# Patient Record
Sex: Male | Born: 1955 | Race: White | Hispanic: No | Marital: Married | Smoking: Former smoker
Health system: Southern US, Community
[De-identification: ages and names within clinical notes are randomized; demographics above are authoritative.]

## PROBLEM LIST (undated history)

## (undated) HISTORY — PX: JOINT REPLACEMENT: SHX530

---

## 2021-02-04 ENCOUNTER — Emergency Department (HOSPITAL_COMMUNITY): Payer: Medicare Other

## 2021-02-04 ENCOUNTER — Other Ambulatory Visit: Payer: Self-pay

## 2021-02-04 ENCOUNTER — Encounter (HOSPITAL_COMMUNITY): Payer: Self-pay

## 2021-02-04 ENCOUNTER — Emergency Department (HOSPITAL_COMMUNITY)
Admission: EM | Admit: 2021-02-04 | Discharge: 2021-02-04 | Disposition: A | Discharge status: Home / Self Care | Payer: Medicare Other | Attending: Emergency Medicine | Admitting: Emergency Medicine

## 2021-02-04 DIAGNOSIS — Z5321 Procedure and treatment not carried out due to patient leaving prior to being seen by health care provider: Secondary | ICD-10-CM | POA: Insufficient documentation

## 2021-02-04 DIAGNOSIS — R0789 Other chest pain: Secondary | ICD-10-CM | POA: Diagnosis present

## 2021-02-04 DIAGNOSIS — R059 Cough, unspecified: Secondary | ICD-10-CM | POA: Diagnosis not present

## 2021-02-04 LAB — BASIC METABOLIC PANEL
Anion gap: 5 (ref 5–15)
BUN: 13 mg/dL (ref 8–23)
CO2: 29 mmol/L (ref 22–32)
Calcium: 8.8 mg/dL — ABNORMAL LOW (ref 8.9–10.3)
Chloride: 104 mmol/L (ref 98–111)
Creatinine, Ser: 0.82 mg/dL (ref 0.61–1.24)
GFR, Estimated: >60 mL/min (ref >60)
Glucose, Bld: 93 mg/dL (ref 70–99)
Potassium: 4.1 mmol/L (ref 3.5–5.1)
Sodium: 138 mmol/L (ref 135–145)

## 2021-02-04 LAB — CBC
HCT: 43 % (ref 39.0–52.0)
Hemoglobin: 14.3 g/dL (ref 13.0–17.0)
MCH: 30.6 pg (ref 26.0–34.0)
MCHC: 33.3 g/dL (ref 30.0–36.0)
MCV: 91.9 fL (ref 80.0–100.0)
Platelets: 182 10*3/uL (ref 150–400)
RBC: 4.68 MIL/uL (ref 4.22–5.81)
RDW: 12.9 % (ref 11.5–15.5)
WBC: 4.8 10*3/uL (ref 4.0–10.5)
nRBC: 0 % (ref 0.0–0.2)

## 2021-02-04 LAB — TROPONIN I (HIGH SENSITIVITY): Troponin I (High Sensitivity): 3 ng/L (ref <18)

## 2021-02-04 NOTE — ED Notes (Signed)
Pt states he wants to leave d/t wait time. Triage RN Melissa notified d/t pt acuity.

## 2021-02-04 NOTE — ED Triage Notes (Signed)
Patient reports chest cough that started this morning that has now turned to chest pain and pressure, denies any radiating pain

## 2022-05-15 IMAGING — CR DG CHEST 2V
2 series · 2 of 2 positions shown · non-contrast
Comparison: None.

CLINICAL DATA: Chest pain and cough

EXAM:
CHEST - 2 VIEW

[chest pa]
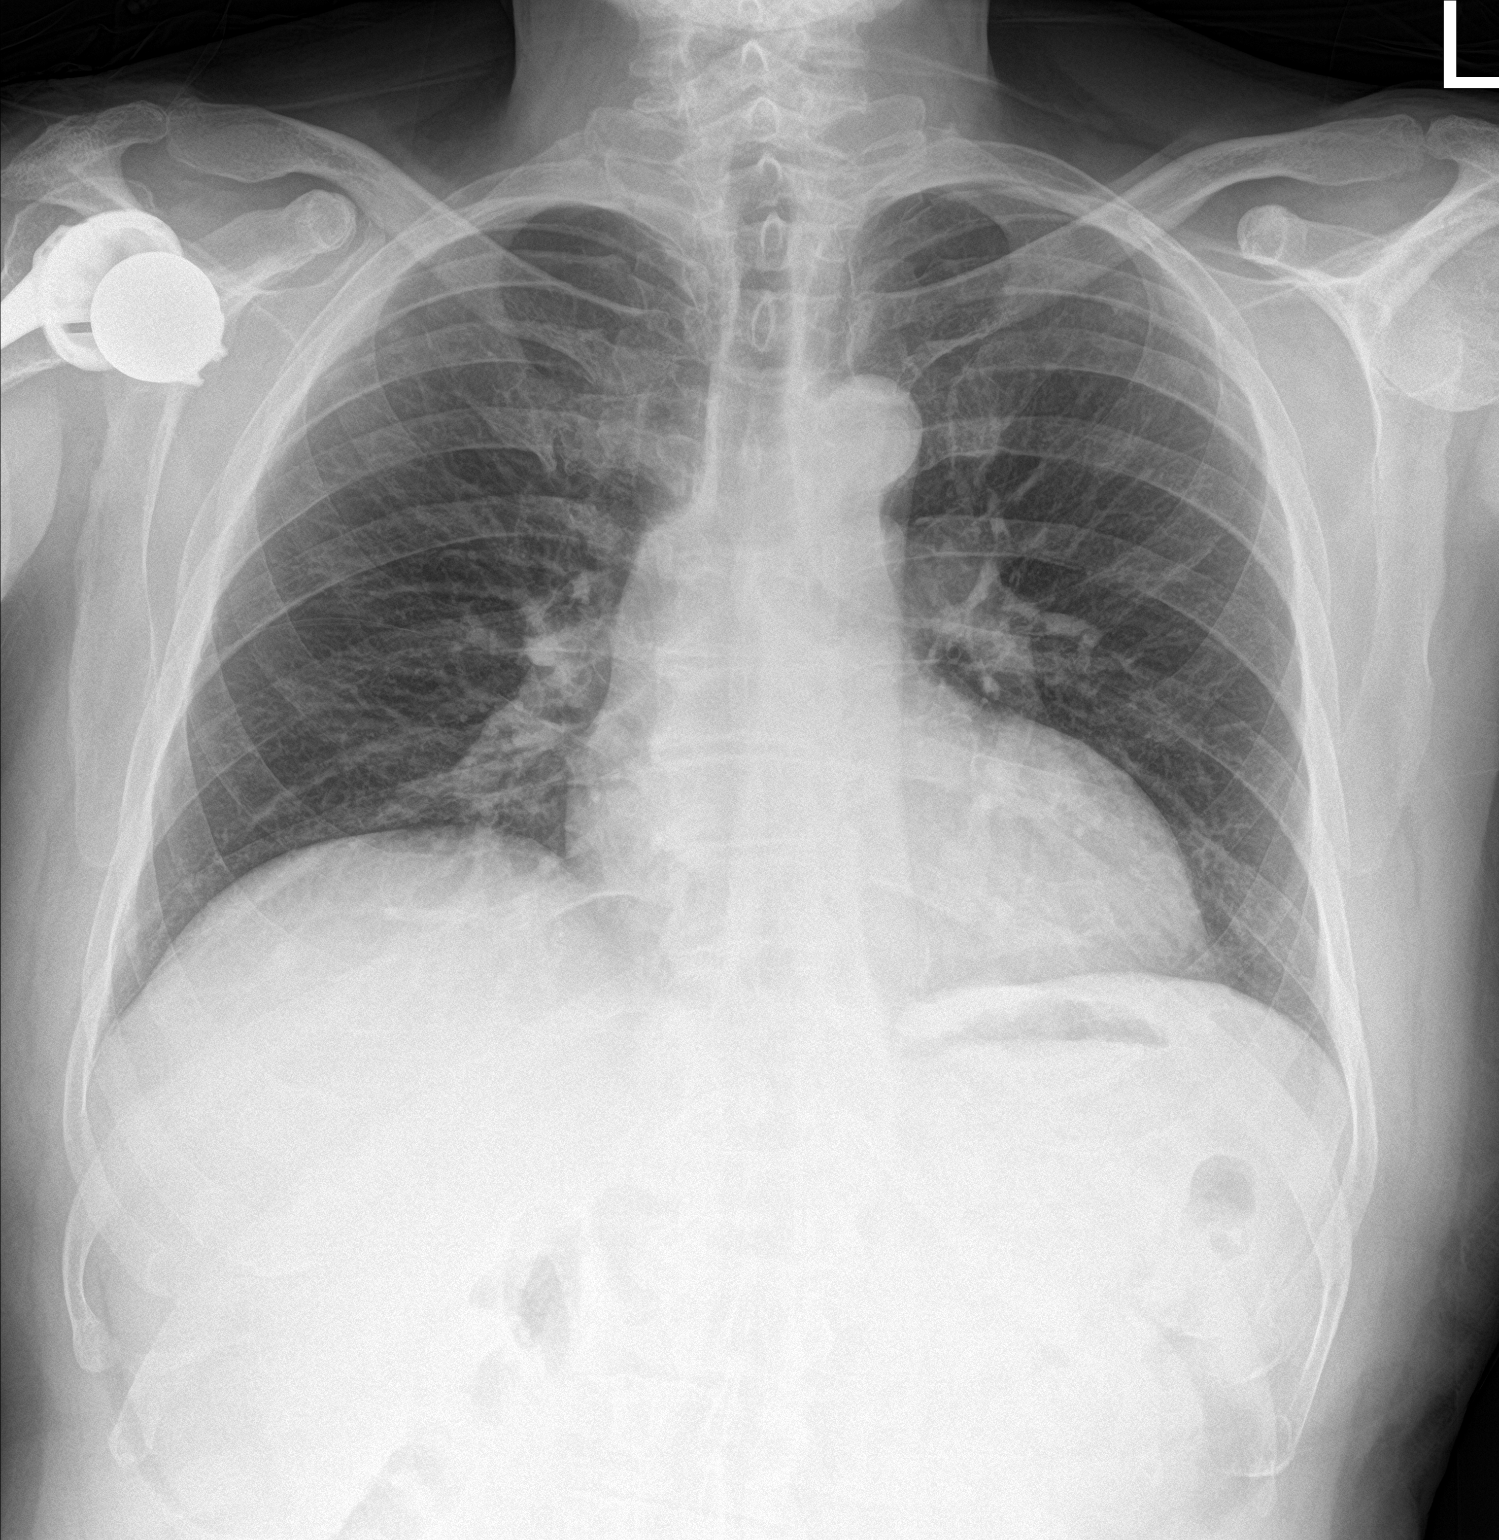

[chest lat]
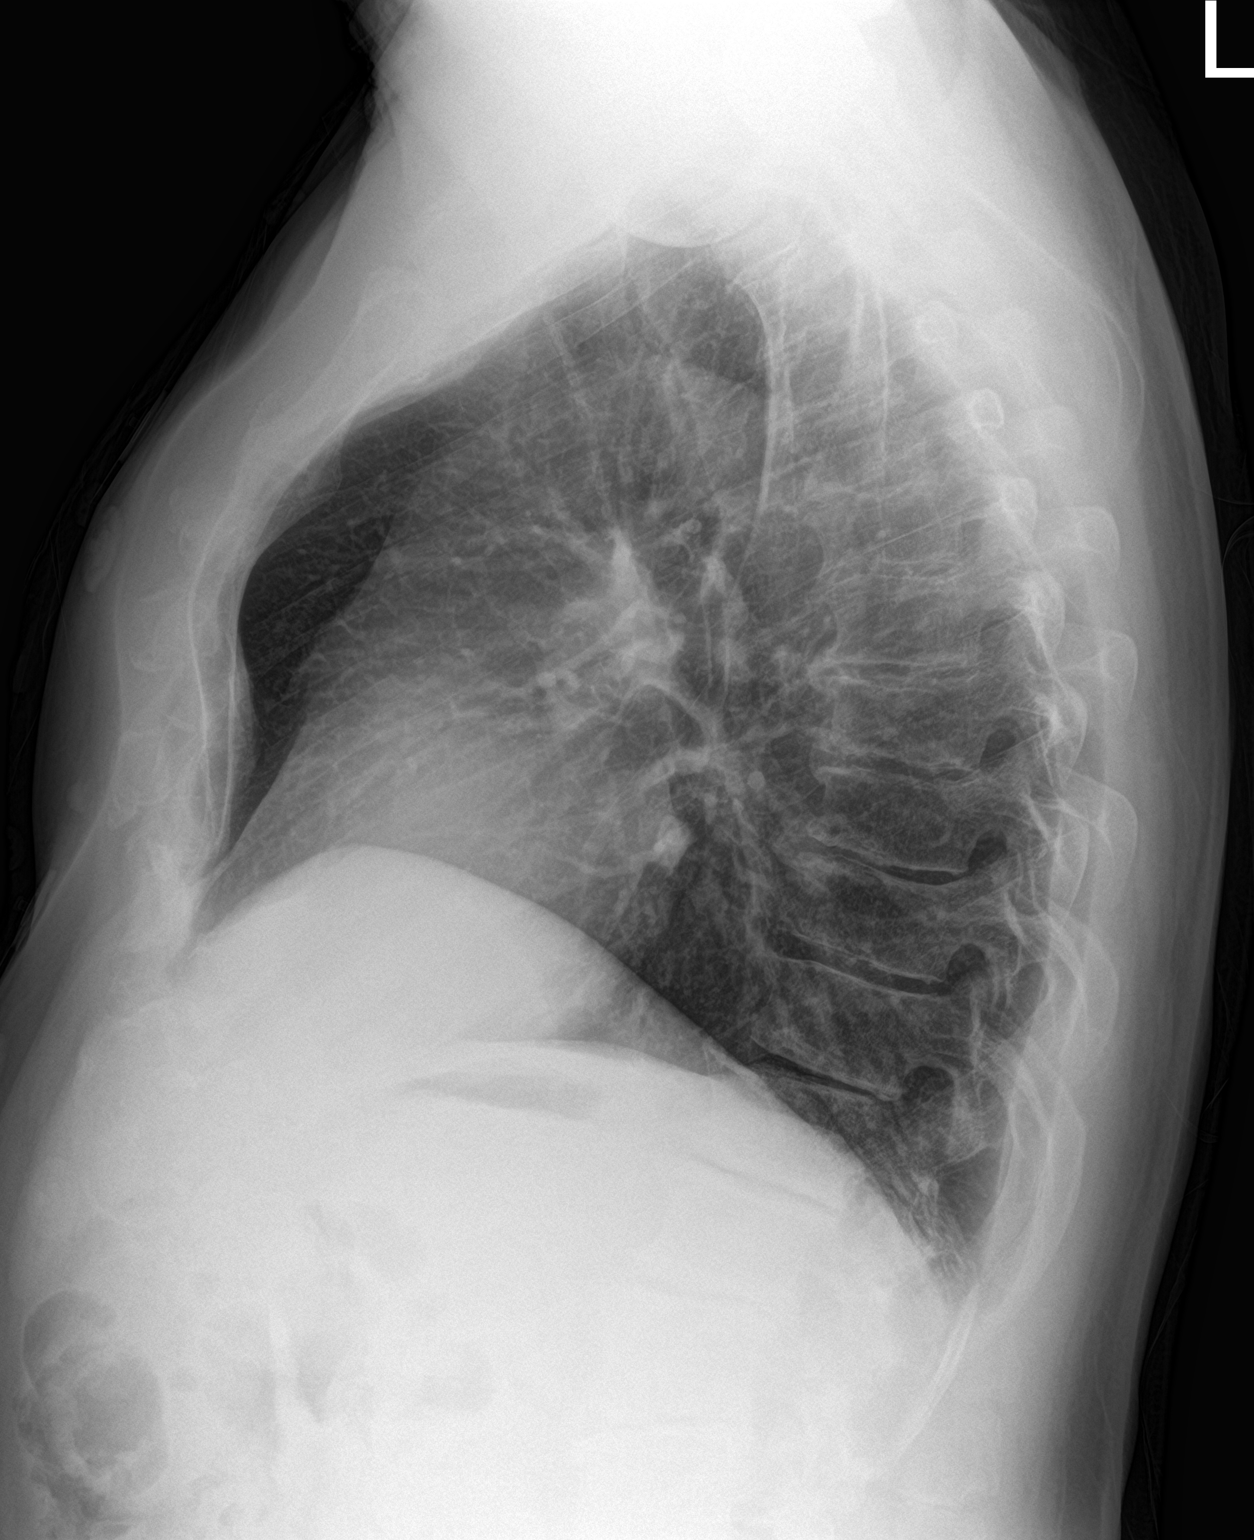

[2 of 2 positions shown; findings below may reference images not displayed]

FINDINGS: Borderline cardiac enlargement. Low lung volumes with bibasilar
atelectasis. No visible pleural effusion or pneumothorax. Reverse
right shoulder arthroplasty.
IMPRESSION: Borderline cardiac enlargement. Low lung volumes with bibasilar
atelectasis.

## 2022-06-03 NOTE — Therapy (Signed)
OUTPATIENT PHYSICAL THERAPY LOWER EXTREMITY EVALUATION   Patient Name: Levi Strickland MRN: [id number] DOB:February 29, 1956, 66 y.o., male Today's Date: 06/04/2022   PT End of Session - 06/04/22 1418     Visit Number 1    Authorization Type Medicare AB    Progress Note Due on Visit 10    PT Start Time 1420    PT Stop Time 1510    PT Time Calculation (min) 50 min    Equipment Utilized During Treatment Other (comment)   single point cane   Activity Tolerance Patient tolerated treatment well    Behavior During Therapy [facility] for tasks assessed/performed             History reviewed. No pertinent past medical history. Past Surgical History:  Procedure Laterality Date   JOINT REPLACEMENT     There are no problems to display for this patient.   PCP: none  REFERRING PROVIDER: Bethann Berkshire MD  REFERRING DIAG: aftercare following left knee joint replacement surgery  THERAPY DIAG:  Left knee pain, unspecified chronicity  Stiffness of left knee, not elsewhere classified  Other abnormalities of gait and mobility  Edema, unspecified type  Rationale for Evaluation and Treatment Rehabilitation  ONSET DATE: 05/22/22 L TKA  SUBJECTIVE:   SUBJECTIVE STATEMENT: Pt reports recent L TKA in [place] in setting of chronic knee pain, fluctuating. Had previous cortisone shots with some relief. Pt received HH PT after surgery in [place]. Follows up with surgeon virtually. Does note that since surgery he has begun to have more soreness in L hip while doing exercises  PERTINENT HISTORY: Previous MSK surgeries  PAIN:  Are you having pain: yes, 2/10 Location: medial L knee How would you describe your pain? Sharp, deep Best: 0/10 Worst: 9/10 Aggravating factors: walking >36min, transfers, lower body dressing, lifting/carrying Easing factors: ice, elevated  PRECAUTIONS: Knee (TKA)  WEIGHT BEARING RESTRICTIONS No  FALLS:  Has patient fallen in last 6 months? 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    PALPATION: Palpable tightness L quad/TFL and hip musculature  LOWER EXTREMITY ROM:  Active/Passive ROM Right eval Left eval  Knee flexion [facility] 78/85  Knee extension 0 -8/-5   (Blank rows = not tested)  Comments: Performed passive physiological movement flex/extension of L knee with gentle oscillations to reduce muscle guarding, pt reports good relief    LOWER EXTREMITY MMT:    MMT Right eval Left eval  Hip flexion 4 4  Hip abduction (modified sitting) 5 5  Knee flexion    Knee extension     (Blank rows = not tested)  Comments: strain with L hip flexion, no pain. Knee MMT deferred  given proximity to surgery   FUNCTIONAL TESTS:  5 times sit to stand: 14 sec STS   - standard chair, no UE support. Significant anterior positioning of LLE and weight shift to RLE noted  GAIT: Distance walked: within clinic Assistive device utilized: Single point cane Level of assistance: Modified independence Comments: partial step through pattern, SPC in RUE, fwd flexed posture, reduced stance time and step length on LLE    TODAY'S TREATMENT: [facility] Adult PT  Treatment:                                                DATE: 06/04/22 Therapeutic Exercise: Quad set x10, soft pillow below knee, cues for setup, tactile cues for quad  Seated heel slide knee flex x10, strap and towel to reduce friction, cues for setup and appropriate ROM, education for HEP LAQ x10, cues and demonstrations for appropriate setup, education as below for HEP   PATIENT EDUCATION:  Education details: Pt education on PT impairments, prognosis, and POC. Rationale for interventions, safe/appropriate HEP performance Person educated: Patient Education method: Explanation, Demonstration, Tactile cues, Verbal cues, and Handouts Education comprehension: verbalized understanding, returned demonstration, verbal cues required, tactile cues required, and needs further education    HOME EXERCISE PROGRAM: Access Code: [email or web address] URL: [email or web address] Date: 06/04/2022 Prepared by: Fransisco Hertz  Exercises - Supine Quad Set  - 1 x daily - 7 x weekly - 3 sets - 10 reps - Seated Long Arc Quad  - 1 x daily - 7 x weekly - 3 sets - 10 reps - Seated Knee Flexion Extension AROM (seated heel slide with strap)  - 1 x daily - 7 x weekly - 3 sets - 10 reps  ASSESSMENT:  CLINICAL IMPRESSION: Patient is a 66 y.o. gentleman who was seen today for physical therapy evaluation and treatment for L knee s/p TKA on 05/22/22. 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 Pt demonstrates gait impairments as noted above, as well as reduced L knee mobility and muscle weakness consistent with post op status. Localized swelling noted in L knee joint as expected, no clinical signs of DVT noted. Pt performs 5xSTS in 14sec which is at/close to fall risk category (12-14sec, varying by population), significantly altered mechanics. Reports good relief with passive physiological movement of knee  joint, gentle joint oscillations to reduce muscle guarding. Pt departs with no notable increase in pain, tolerates session well. Recommend skilled PT to address aforementioned deficits and maximize functional independence.   OBJECTIVE IMPAIRMENTS Abnormal gait, decreased activity tolerance, decreased endurance, decreased mobility, difficulty walking, decreased ROM, decreased strength, hypomobility, increased edema, and pain.   ACTIVITY LIMITATIONS carrying, lifting, bending, standing, squatting, transfers, and dressing  PARTICIPATION LIMITATIONS: driving, community activity, and occupation  PERSONAL FACTORS  None  are also affecting patient's functional outcome.   REHAB POTENTIAL: Good  CLINICAL DECISION MAKING: Stable/uncomplicated  EVALUATION COMPLEXITY: Low   GOALS: Goals reviewed with patient? Yes  SHORT TERM GOALS: Target date: 07/02/2022  Pt will demonstrate appropriate understanding and performance of initially prescribed HEP in order to facilitate improved independence with management of symptoms.  Baseline: HEP provided on eval Goal status: INITIAL   2. Pt will score greater than or equal to 54 on FOTO in order to demonstrate improved perception of function  due to symptoms.  Baseline: 49  Goal status: INITIAL   LONG TERM GOALS: Target date: 07/30/2022   Pt will score 62 or greater on FOTO in order to demonstrate improved perception of function due to symptoms.  Baseline: 49 Goal status: INITIAL  2.  Pt will demonstrate at least 0-120 degrees of L knee AROM in order to facilitate improved tolerance to functional movements such as walking and lifting.  Baseline: -8 ext, 78 flexion Goal status: INITIAL  3.  Pt will be able to lift up to 10# in order to demonstrate improved capacity for daily activities such as lifting/carrying drapery for work activities.  Baseline: NT due to proximity to surgery Goal status: INITIAL  4.  Pt will be able to perform 5xSTS in less than  or equal to 11sec with appropriate mechanics in order to demonstrate reduced fall risk and improved functional independence (MCID 5xSTS = 2.3 sec). Baseline: 14 sec, with significant anterior placement of LLE Goal status: INITIAL    PLAN: PT FREQUENCY: 2x/week  PT DURATION: 8 weeks  PLANNED INTERVENTIONS: Therapeutic exercises, Therapeutic activity, Neuromuscular re-education, Balance training, Gait training, Patient/Family education, Self Care, Joint mobilization, Stair training, DME instructions, Dry Needling, Electrical stimulation, Cryotherapy, Moist heat, Vasopneumatic device, Manual therapy, and Re-evaluation  PLAN FOR NEXT SESSION: Progress ROM/strengthening exercises as able/appropriate, review HEP.    Ashley Murrain PT, DPT 06/04/2022 4:47 PM

## 2022-06-04 ENCOUNTER — Encounter: Payer: Self-pay | Admitting: Physical Therapy

## 2022-06-04 ENCOUNTER — Ambulatory Visit: Payer: Medicare Other | Attending: Family Medicine | Admitting: Physical Therapy

## 2022-06-04 ENCOUNTER — Ambulatory Visit: Payer: Medicare Other | Admitting: Physical Therapy

## 2022-06-04 ENCOUNTER — Other Ambulatory Visit: Payer: Self-pay

## 2022-06-04 DIAGNOSIS — M25662 Stiffness of left knee, not elsewhere classified: Secondary | ICD-10-CM | POA: Diagnosis present

## 2022-06-04 DIAGNOSIS — R609 Edema, unspecified: Secondary | ICD-10-CM | POA: Diagnosis present

## 2022-06-04 DIAGNOSIS — M25562 Pain in left knee: Secondary | ICD-10-CM | POA: Insufficient documentation

## 2022-06-04 DIAGNOSIS — R2689 Other abnormalities of gait and mobility: Secondary | ICD-10-CM | POA: Insufficient documentation

## 2022-06-13 ENCOUNTER — Ambulatory Visit: Payer: Medicare Other | Admitting: Physical Therapy

## 2022-06-13 NOTE — Therapy (Signed)
OUTPATIENT PHYSICAL THERAPY TREATMENT NOTE   Patient Name: Levi Strickland MRN: [id number] DOB:23-Oct-1955, 66 y.o., male Today's Date: 06/14/2022  PCP: none in chart   REFERRING PROVIDER: Bethann Berkshire MD  END OF SESSION:   PT End of Session - 06/14/22 1017     Visit Number 2    Number of Visits 17    Date for PT Re-Evaluation 08/02/22    Authorization Type Medicare AB    Progress Note Due on Visit 10    PT Start Time 1017    PT Stop Time 1100    PT Time Calculation (min) 43 min    Activity Tolerance Patient tolerated treatment well    Behavior During Therapy [facility] for tasks assessed/performed             History reviewed. No pertinent past medical history. Past Surgical History:  Procedure Laterality Date   JOINT REPLACEMENT     There are no problems to display for this patient.   REFERRING DIAG: aftercare following left knee joint replacement surgery  THERAPY DIAG:  Left knee pain, unspecified chronicity  Stiffness of left knee, not elsewhere classified  Other abnormalities of gait and mobility  Edema, unspecified type  Rationale for Evaluation and Treatment Rehabilitation  PERTINENT HISTORY: Previous MSK surgeries  PRECAUTIONS: s/p TKA 05/22/22  SUBJECTIVE: Pt arrives without cane, states he has been off it for a few days and feels pretty steady. Pt reports good compliance with HEP, no issues, reports minimal pain at present. No other new updates.   PAIN:  Are you having pain: yes, 1/10 Location: medial L knee How would you describe your pain? Sharp, deep Best in past week: 0/10 Worst in past week: 7/10 Aggravating factors: walking >53min, transfers, lower body dressing, lifting/carrying, prolonged sitting/driving Easing factors: ice, elevated   OBJECTIVE: (objective measures completed at initial evaluation unless otherwise dated)   DIAGNOSTIC FINDINGS:  None in chart     PATIENT SURVEYS:  FOTO 49   COGNITION:           Overall  cognitive status: Within functional limits for tasks assessed                          SENSATION: Does not endorse any sensory deficits   EDEMA:  Generalized swelling L knee joint - figure 8 deferred as pt arrives with dressing on incision - appears clean and intact, no concerns.       PALPATION: Palpable tightness L quad/TFL and hip musculature   LOWER EXTREMITY ROM:   Active/Passive ROM Right eval Left eval  Knee flexion [facility] 78/85  Knee extension 0 -8/-5   (Blank rows = not tested)   Comments: Performed passive physiological movement flex/extension of L knee with gentle oscillations to reduce muscle guarding, pt reports good relief       LOWER EXTREMITY MMT:     MMT Right eval Left eval  Hip flexion 4 4  Hip abduction (modified sitting) 5 5  Knee flexion      Knee extension       (Blank rows = not tested)   Comments: strain with L hip flexion, no pain. Knee MMT deferred given proximity to surgery     FUNCTIONAL TESTS:  5 times sit to stand: 14 sec STS   - standard chair, no UE support. Significant anterior positioning of LLE and weight shift to RLE noted   GAIT: Distance walked: within clinic Assistive device utilized:  Single point cane Level of assistance: Modified independence Comments: partial step through pattern, SPC in RUE, fwd flexed posture, reduced stance time and step length on LLE       TODAY'S TREATMENT: [facility] Adult PT Treatment:                                                DATE: 06/14/22 Therapeutic Exercise: Seated heel slide knee flex 2x10, strap + slider, cues for hold and breath control LAQ 2x10 Quad set 3x10, cues for reduced compensation at hip SLR 2x8 cues for pacing and full knee extension Bridge 2x10 cues for form STS 2x12 from raised mat, cues for symmetrical WB Lateral 4 inch step up 2x8 with UE support, cues for improved knee mechanics/velocity  Manual Therapy: Reclined + supine: soft tissue mobilization to quad, TFL,  hamstring passive physiological movement to knee joint flex/ext within tolerated ROM, oscillations for reduced muscle guarding   [facility] Adult PT Treatment:                                                DATE: 06/04/22 Therapeutic Exercise: Quad set x10, soft pillow below knee, cues for setup, tactile cues for quad  Seated heel slide knee flex x10, strap and towel to reduce friction, cues for setup and appropriate ROM, education for HEP LAQ x10, cues and demonstrations for appropriate setup, education as below for HEP     PATIENT EDUCATION:  Education details: HEP, rationale for interventions throughout Person educated: Patient Education method: Explanation, Demonstration, Tactile cues, Verbal cues Education comprehension: verbalized understanding, returned demonstration, verbal cues required, tactile cues required, and needs further education      HOME EXERCISE PROGRAM: Access Code: [email or web address] URL: [email or web address] Date: 06/04/2022 Prepared by: Fransisco Hertz   Exercises - Supine Quad Set  - 1 x daily - 7 x weekly - 3 sets - 10 reps - Seated Long Arc Quad  - 1 x daily - 7 x weekly - 3 sets - 10 reps - Seated Knee Flexion Extension AROM (seated heel slide with strap)  - 1 x daily - 7 x weekly - 3 sets - 10 reps   ASSESSMENT:   CLINICAL IMPRESSION: Patient is a 66 y.o. gentleman who was seen today for physical therapy evaluation and treatment for L knee s/p TKA on 05/22/22.  Today's session emphasizing quad activation, closed chain LE stability, and knee mobility. Able to progress for increased volume of activity and reduced rest breaks. Pt also able to demonstrate appropriate SLR on this date without lag, tolerates well. Pt continues to demonstrate limitations in knee mobility and strength, gait impairments, and closed chain stability of LE, all as expected post operatively. Denies any increase in pain with activity on this date, tolerates session well overall with no  adverse events.   Recommend skilled PT to address aforementioned deficits to maximize functional independence and tolerance. Pt departs today's session in no acute distress, all voiced questions/concerns addressed appropriately from PT perspective.         OBJECTIVE IMPAIRMENTS Abnormal gait, decreased activity tolerance, decreased endurance, decreased mobility, difficulty walking, decreased ROM, decreased strength, hypomobility, increased edema, and pain.    ACTIVITY LIMITATIONS carrying, lifting, bending, standing, squatting, transfers, and  dressing   PARTICIPATION LIMITATIONS: driving, community activity, and occupation   PERSONAL FACTORS  None  are also affecting patient's functional outcome.    REHAB POTENTIAL: Good   CLINICAL DECISION MAKING: Stable/uncomplicated   EVALUATION COMPLEXITY: Low     GOALS: Goals reviewed with patient? Yes   SHORT TERM GOALS: Target date: 07/02/2022  Pt will demonstrate appropriate understanding and performance of initially prescribed HEP in order to facilitate improved independence with management of symptoms.  Baseline: HEP provided on eval Goal status: INITIAL    2. Pt will score greater than or equal to 54 on FOTO in order to demonstrate improved perception of function due to symptoms.            Baseline: 49            Goal status: INITIAL    LONG TERM GOALS: Target date: 07/30/2022    Pt will score 62 or greater on FOTO in order to demonstrate improved perception of function due to symptoms.  Baseline: 49 Goal status: INITIAL   2.  Pt will demonstrate at least 0-120 degrees of L knee AROM in order to facilitate improved tolerance to functional movements such as walking and lifting.  Baseline: -8 ext, 78 flexion Goal status: INITIAL   3.  Pt will be able to lift up to 10# in order to demonstrate improved capacity for daily activities such as lifting/carrying drapery for work activities.  Baseline: NT due to proximity to surgery Goal  status: INITIAL   4.  Pt will be able to perform 5xSTS in less than or equal to 11sec with appropriate mechanics in order to demonstrate reduced fall risk and improved functional independence (MCID 5xSTS = 2.3 sec). Baseline: 14 sec, with significant anterior placement of LLE Goal status: INITIAL      PLAN: PT FREQUENCY: 2x/week   PT DURATION: 8 weeks   PLANNED INTERVENTIONS: Therapeutic exercises, Therapeutic activity, Neuromuscular re-education, Balance training, Gait training, Patient/Family education, Self Care, Joint mobilization, Stair training, DME instructions, Dry Needling, Electrical stimulation, Cryotherapy, Moist heat, Vasopneumatic device, Manual therapy, and Re-evaluation   PLAN FOR NEXT SESSION: Progress ROM/strengthening exercises as able/appropriate, review/update HEP.      Leeroy Cha PT, DPT 06/14/2022 11:15 AM

## 2022-06-14 ENCOUNTER — Encounter: Payer: Self-pay | Admitting: Physical Therapy

## 2022-06-14 ENCOUNTER — Ambulatory Visit: Payer: Medicare Other | Admitting: Physical Therapy

## 2022-06-14 DIAGNOSIS — M25562 Pain in left knee: Secondary | ICD-10-CM

## 2022-06-14 DIAGNOSIS — R609 Edema, unspecified: Secondary | ICD-10-CM

## 2022-06-14 DIAGNOSIS — M25662 Stiffness of left knee, not elsewhere classified: Secondary | ICD-10-CM

## 2022-06-14 DIAGNOSIS — R2689 Other abnormalities of gait and mobility: Secondary | ICD-10-CM

## 2022-06-17 ENCOUNTER — Ambulatory Visit: Payer: Medicare Other | Admitting: Physical Therapy

## 2022-06-18 NOTE — Therapy (Signed)
OUTPATIENT PHYSICAL THERAPY TREATMENT NOTE   Patient Name: Levi Strickland MRN: [id number] DOB:08/03/1956, 66 y.o., male Today's Date: 06/19/2022  PCP: none in chart   REFERRING PROVIDER: Bethann Berkshire MD  END OF SESSION:   PT End of Session - 06/19/22 0936     Visit Number 3    Number of Visits 17    Date for PT Re-Evaluation 08/02/22    Authorization Type Medicare AB    Progress Note Due on Visit 10    PT Start Time 0936    PT Stop Time 1009    PT Time Calculation (min) 33 min    Activity Tolerance Patient tolerated treatment well;No increased pain    Behavior During Therapy [facility] for tasks assessed/performed              History reviewed. No pertinent past medical history. Past Surgical History:  Procedure Laterality Date   JOINT REPLACEMENT     There are no problems to display for this patient.   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 Denies any difficulty with HEP, good compliance. States he continues to do well overall and has been trying to do stairs with reciprocal pattern. No other new updates  PAIN:  Are you having pain: yes, 1/10 Location: medial L knee How would you describe your pain? Sharp, deep Best in past week: 0/10 Worst in past week: 3-4/10 Aggravating factors: walking >48min, transfers, lower body dressing, lifting/carrying, prolonged sitting/driving Easing factors: ice, elevated   OBJECTIVE: (objective measures completed at initial evaluation unless otherwise dated)   DIAGNOSTIC FINDINGS:  None in  chart     PATIENT SURVEYS:  FOTO 49   COGNITION:           Overall cognitive status: Within functional limits for tasks assessed                          SENSATION: Does not endorse any sensory deficits   EDEMA:  Generalized swelling L knee joint - figure 8 deferred as pt arrives with dressing on incision - appears clean and intact, no concerns.       PALPATION: Palpable tightness L quad/TFL and hip musculature   LOWER EXTREMITY ROM:   Active/Passive ROM Right eval Left eval  Knee flexion [facility] 78/85  Knee extension 0 -8/-5   (Blank rows = not tested)   Comments: Performed passive physiological movement flex/extension of L knee with gentle oscillations to reduce muscle guarding, pt reports good relief       LOWER EXTREMITY MMT:     MMT Right eval Left eval  Hip flexion 4 4  Hip abduction (modified sitting) 5 5  Knee flexion      Knee extension       (Blank rows = not tested)   Comments: strain with L hip flexion, no pain. Knee MMT deferred given proximity to surgery     FUNCTIONAL TESTS:  5 times sit to stand: 14 sec STS   - standard chair, no UE support. Significant anterior positioning of LLE and weight shift to RLE noted   GAIT:  Distance walked: within clinic Assistive device utilized: Single point cane Level of assistance: Modified independence Comments: partial step through pattern, SPC in RUE, fwd flexed posture, reduced stance time and step length on LLE       TODAY'S TREATMENT: [facility] Adult PT Treatment:                                                DATE: 06/19/22 Therapeutic Exercise: Seated heel slide knee flex x20, strap + slider, cues for hold and breath control Quad set 3x10 propped on towel SLR 3x12 cues for pacing and full knee extension Bridge 2x10 cues for form STS 2x10 from raised mat, cues for symmetrical WB Fwd lunge onto airex 3x8, UE support as needed  Manual Therapy: Reclined + supine: soft tissue mobilization to quad, TFL,  hamstring passive physiological movement to knee joint flex/ext within tolerated ROM, oscillations for reduced muscle guarding Contract relax hamstrings 2x5 towards full knee ext with hip at 45 deg flex   [facility] Adult PT Treatment:                                                DATE: 06/14/22 Therapeutic Exercise: Seated heel slide knee flex 2x10, strap + slider, cues for hold and breath control LAQ 2x10 Quad set 3x10, cues for reduced compensation at hip SLR 2x8 cues for pacing and full knee extension Bridge 2x10 cues for form STS 2x12 from raised mat, cues for symmetrical WB Lateral 4 inch step up 2x8 with UE support, cues for improved knee mechanics/velocity  Manual Therapy: Reclined + supine: soft tissue mobilization to quad, TFL, hamstring passive physiological movement to knee joint flex/ext within tolerated ROM, oscillations for reduced muscle guarding      PATIENT EDUCATION:  Education details: HEP, rationale for interventions throughout Person educated: Patient Education method: Explanation, Demonstration, Tactile cues, Verbal cues Education comprehension: verbalized understanding, returned demonstration, verbal cues required, tactile cues required, and needs further education      HOME EXERCISE PROGRAM: Access Code: [email or web address] URL: [email or web address] Date: 06/04/2022 Prepared by: Fransisco Hertz   Exercises - Supine Quad Set  - 1 x daily - 7 x weekly - 3 sets - 10 reps - Seated Long Arc Quad  - 1 x daily - 7 x weekly - 3 sets - 10 reps - Seated Knee Flexion Extension AROM (seated heel slide with strap)  - 1 x daily - 7 x weekly - 3 sets - 10 reps   ASSESSMENT:   CLINICAL IMPRESSION: Patient is a 66 y.o. gentleman who was seen today for physical therapy treatment for L knee s/p TKA on 05/22/22. Today's session emphasizing quad activation/endurance in open and closed chain, knee mobility. Addition of hamstring contract/relax on LLE with good response,  improved mobility. Able to progress for increased volume with activity and introduction of unstable surfaces for increased lower limb stability. Initial instability with unstable surface training but improves with repetition. Pt continues to demonstrate post op impairments as expected, including reduced knee mobility/strength and impaired mechanics with functional movements. Departs with report of no increase in pain, tolerates session well overall.  Recommend skilled PT to address aforementioned deficits to maximize functional independence and tolerance. Pt departs today's session in  no acute distress, all voiced questions/concerns addressed appropriately from PT perspective.      OBJECTIVE IMPAIRMENTS Abnormal gait, decreased activity tolerance, decreased endurance, decreased mobility, difficulty walking, decreased ROM, decreased strength, hypomobility, increased edema, and pain.    ACTIVITY LIMITATIONS carrying, lifting, bending, standing, squatting, transfers, and dressing   PARTICIPATION LIMITATIONS: driving, community activity, and occupation   PERSONAL FACTORS  None  are also affecting patient's functional outcome.    REHAB POTENTIAL: Good   CLINICAL DECISION MAKING: Stable/uncomplicated   EVALUATION COMPLEXITY: Low     GOALS: Goals reviewed with patient? Yes   SHORT TERM GOALS: Target date: 07/02/2022  Pt will demonstrate appropriate understanding and performance of initially prescribed HEP in order to facilitate improved independence with management of symptoms.  Baseline: HEP provided on eval Goal status: INITIAL    2. Pt will score greater than or equal to 54 on FOTO in order to demonstrate improved perception of function due to symptoms.            Baseline: 49            Goal status: INITIAL    LONG TERM GOALS: Target date: 07/30/2022    Pt will score 62 or greater on FOTO in order to demonstrate improved perception of function due to symptoms.  Baseline: 49 Goal  status: INITIAL   2.  Pt will demonstrate at least 0-120 degrees of L knee AROM in order to facilitate improved tolerance to functional movements such as walking and lifting.  Baseline: -8 ext, 78 flexion Goal status: INITIAL   3.  Pt will be able to lift up to 10# in order to demonstrate improved capacity for daily activities such as lifting/carrying drapery for work activities.  Baseline: NT due to proximity to surgery Goal status: INITIAL   4.  Pt will be able to perform 5xSTS in less than or equal to 11sec with appropriate mechanics in order to demonstrate reduced fall risk and improved functional independence (MCID 5xSTS = 2.3 sec). Baseline: 14 sec, with significant anterior placement of LLE Goal status: INITIAL      PLAN: PT FREQUENCY: 2x/week   PT DURATION: 8 weeks   PLANNED INTERVENTIONS: Therapeutic exercises, Therapeutic activity, Neuromuscular re-education, Balance training, Gait training, Patient/Family education, Self Care, Joint mobilization, Stair training, DME instructions, Dry Needling, Electrical stimulation, Cryotherapy, Moist heat, Vasopneumatic device, Manual therapy, and Re-evaluation   PLAN FOR NEXT SESSION: Progress knee flexion and unstable surface training     Leeroy Cha PT, DPT 06/19/2022 11:24 AM

## 2022-06-19 ENCOUNTER — Encounter: Payer: Self-pay | Admitting: Physical Therapy

## 2022-06-19 ENCOUNTER — Ambulatory Visit: Payer: Medicare Other | Admitting: Physical Therapy

## 2022-06-19 DIAGNOSIS — R609 Edema, unspecified: Secondary | ICD-10-CM

## 2022-06-19 DIAGNOSIS — R2689 Other abnormalities of gait and mobility: Secondary | ICD-10-CM

## 2022-06-19 DIAGNOSIS — M25662 Stiffness of left knee, not elsewhere classified: Secondary | ICD-10-CM

## 2022-06-19 DIAGNOSIS — M25562 Pain in left knee: Secondary | ICD-10-CM | POA: Diagnosis not present

## 2022-06-24 ENCOUNTER — Ambulatory Visit: Payer: Medicare Other | Admitting: Physical Therapy

## 2022-06-25 NOTE — Therapy (Incomplete)
OUTPATIENT PHYSICAL THERAPY TREATMENT NOTE   Patient Name: Levi Strickland MRN: [id number] DOB:[birth year], 66 y.o., male Today's Date: 06/26/2022  PCP: none in chart   REFERRING PROVIDER: Bethann Berkshire MD  END OF SESSION:   PT End of Session - 06/26/22 0930     Visit Number 4    Number of Visits 17    Date for PT Re-Evaluation 08/02/22    Authorization Type Medicare AB    Progress Note Due on Visit 10    PT Start Time 0931    PT Stop Time 1010    PT Time Calculation (min) 39 min    Activity Tolerance Patient tolerated treatment well;No increased pain    Behavior During Therapy [facility] for tasks assessed/performed               History reviewed. No pertinent past medical history. Past Surgical History:  Procedure Laterality Date   JOINT REPLACEMENT     There are no problems to display for this patient.   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 No new updates  PAIN:  Are you having pain: 0/10 Location: medial L knee How would you describe your pain? Sharp, deep Best in past week: 0/10 Worst in past week: 3-4/10 Aggravating factors: walking >28min, transfers, lower body dressing, lifting/carrying, prolonged sitting/driving Easing factors: ice, elevated   OBJECTIVE: (objective measures completed at initial evaluation unless otherwise dated)   DIAGNOSTIC FINDINGS:  None in chart     PATIENT SURVEYS:  FOTO 49   COGNITION:           Overall cognitive status: Within functional limits for tasks assessed                           SENSATION: Does not endorse any sensory deficits   EDEMA:  Generalized swelling L knee joint - figure 8 deferred as pt arrives with dressing on incision - appears clean and intact, no concerns.       PALPATION: Palpable tightness L quad/TFL and hip musculature   LOWER EXTREMITY ROM:   Active/Passive ROM Right eval Left eval Left 06/26/22  Knee flexion [facility] 78/85 90/92  Knee extension 0 -8/-5 0/0 (subjective tightness hamstrings)   (Blank rows = not tested)   Comments: Performed passive physiological movement flex/extension of L knee with gentle oscillations to reduce muscle guarding, pt reports good relief       LOWER EXTREMITY MMT:     MMT Right eval Left eval  Hip flexion 4 4  Hip abduction (modified sitting) 5 5  Knee flexion      Knee extension       (Blank rows = not tested)   Comments: strain with L hip flexion, no pain. Knee MMT deferred given proximity to surgery     FUNCTIONAL TESTS:  5 times sit to stand: 14 sec STS   - standard chair, no UE support. Significant anterior positioning of LLE and weight shift to RLE noted   GAIT: Distance walked: within clinic Assistive device utilized: Single point cane Level of assistance: Modified independence Comments: partial step through  pattern, SPC in RUE, fwd flexed posture, reduced stance time and step length on LLE       TODAY'S TREATMENT: [facility] Adult PT Treatment:                                                DATE: 06/26/22 Therapeutic Exercise: Propped quad set 2x15 LAQ 3# 2x12 Fwd airex lunge 2x12, floating UE support as needed Squat with B UE support at cable column, cues for ROM and weight shift 2x10 4inch step fwd lunge for increased knee flexion 4x5 w 5sec hold Standing heel raise 2x12  Manual Therapy: Reclined + supine: soft tissue mobilization to quad, TFL, hamstring passive physiological movement to knee joint flex/ext within tolerated ROM, oscillations for reduced muscle guarding     [facility]  Adult PT Treatment:                                                DATE: 06/19/22 Therapeutic Exercise: Seated heel slide knee flex x20, strap + slider, cues for hold and breath control Quad set 3x10 propped on towel SLR 3x12 cues for pacing and full knee extension Bridge 2x10 cues for form STS 2x10 from raised mat, cues for symmetrical WB Fwd lunge onto airex 3x8, UE support as needed  Manual Therapy: Reclined + supine: soft tissue mobilization to quad, TFL, hamstring passive physiological movement to knee joint flex/ext within tolerated ROM, oscillations for reduced muscle guarding Contract relax hamstrings 2x5 towards full knee ext with hip at 45 deg flex      PATIENT EDUCATION:  Education details: HEP, rationale for interventions throughout Person educated: Patient Education method: Explanation, Demonstration, Tactile cues, Verbal cues Education comprehension: verbalized understanding, returned demonstration, verbal cues required, tactile cues required, and needs further education      HOME EXERCISE PROGRAM: Access Code: [email or web address] URL: [email or web address] Date: 06/04/2022 Prepared by: Levi Strickland   Exercises - Supine Quad Set  - 1 x daily - 7 x weekly - 3 sets - 10 reps - Seated Long Arc Quad  - 1 x daily - 7 x weekly - 3 sets - 10 reps - Seated Knee Flexion Extension AROM (seated heel slide with strap)  - 1 x daily - 7 x weekly - 3 sets - 10 reps   ASSESSMENT:   CLINICAL IMPRESSION: Patient is a 66 y.o. gentleman who was seen today for physical therapy treatment for L knee s/p TKA on 05/22/22. Pt continues to tolerate session well - modest improvement in knee flexion compared to initial eval but today is noted to have full knee extension on L as described above, although subjective hamstring tightness noted. Pt able to progress for increased time spent in WB today, emphasis on knee flexion in open and closed chain. Pt denies any increase in pain as session  progresses, tolerates exercise well. Continues to demonstrate limitations as expected in LE strength/mobility, gait impairments, and altered mechanics with functional movements.   Recommend skilled PT to address aforementioned deficits to maximize functional independence and tolerance. Pt departs today's session in no acute distress, all voiced questions/concerns addressed appropriately from PT perspective.      OBJECTIVE IMPAIRMENTS Abnormal gait, decreased activity tolerance, decreased endurance, decreased mobility, difficulty walking, decreased  ROM, decreased strength, hypomobility, increased edema, and pain.    ACTIVITY LIMITATIONS carrying, lifting, bending, standing, squatting, transfers, and dressing   PARTICIPATION LIMITATIONS: driving, community activity, and occupation   PERSONAL FACTORS  None  are also affecting patient's functional outcome.    REHAB POTENTIAL: Good   CLINICAL DECISION MAKING: Stable/uncomplicated   EVALUATION COMPLEXITY: Low     GOALS: Goals reviewed with patient? Yes   SHORT TERM GOALS: Target date: 07/02/2022  Pt will demonstrate appropriate understanding and performance of initially prescribed HEP in order to facilitate improved independence with management of symptoms.  Baseline: HEP provided on eval Goal status: INITIAL    2. Pt will score greater than or equal to 54 on FOTO in order to demonstrate improved perception of function due to symptoms.            Baseline: 49            Goal status: INITIAL    LONG TERM GOALS: Target date: 07/30/2022    Pt will score 62 or greater on FOTO in order to demonstrate improved perception of function due to symptoms.  Baseline: 49 Goal status: INITIAL   2.  Pt will demonstrate at least 0-120 degrees of L knee AROM in order to facilitate improved tolerance to functional movements such as walking and lifting.  Baseline: -8 ext, 78 flexion Goal status: INITIAL   3.  Pt will be able to lift up to 10# in order  to demonstrate improved capacity for daily activities such as lifting/carrying drapery for work activities.  Baseline: NT due to proximity to surgery Goal status: INITIAL   4.  Pt will be able to perform 5xSTS in less than or equal to 11sec with appropriate mechanics in order to demonstrate reduced fall risk and improved functional independence (MCID 5xSTS = 2.3 sec). Baseline: 14 sec, with significant anterior placement of LLE Goal status: INITIAL      PLAN: PT FREQUENCY: 2x/week   PT DURATION: 8 weeks   PLANNED INTERVENTIONS: Therapeutic exercises, Therapeutic activity, Neuromuscular re-education, Balance training, Gait training, Patient/Family education, Self Care, Joint mobilization, Stair training, DME instructions, Dry Needling, Electrical stimulation, Cryotherapy, Moist heat, Vasopneumatic device, Manual therapy, and Re-evaluation   PLAN FOR NEXT SESSION: continue to progress knee flexion and unstable surface training as able/appropriate. Consider addition of hamstring curls     Ashley Murrain PT, DPT 06/26/2022 10:13 AM

## 2022-06-26 ENCOUNTER — Ambulatory Visit: Payer: Medicare Other | Attending: Family Medicine | Admitting: Physical Therapy

## 2022-06-26 ENCOUNTER — Encounter: Payer: Self-pay | Admitting: Physical Therapy

## 2022-06-26 DIAGNOSIS — R2689 Other abnormalities of gait and mobility: Secondary | ICD-10-CM | POA: Diagnosis present

## 2022-06-26 DIAGNOSIS — R609 Edema, unspecified: Secondary | ICD-10-CM | POA: Diagnosis present

## 2022-06-26 DIAGNOSIS — M25662 Stiffness of left knee, not elsewhere classified: Secondary | ICD-10-CM | POA: Diagnosis present

## 2022-06-26 DIAGNOSIS — M25562 Pain in left knee: Secondary | ICD-10-CM | POA: Insufficient documentation

## 2022-06-28 ENCOUNTER — Ambulatory Visit: Payer: Medicare Other | Admitting: Physical Therapy

## 2022-07-01 ENCOUNTER — Ambulatory Visit: Payer: Medicare Other | Admitting: Physical Therapy

## 2022-07-02 NOTE — Therapy (Signed)
OUTPATIENT PHYSICAL THERAPY TREATMENT NOTE   Patient Name: Levi Strickland MRN: [id number] DOB:1955/10/14, 66 y.o., male Today's Date: 07/03/2022  PCP: none in chart   REFERRING PROVIDER: Gwenlyn Found MD  END OF SESSION:   PT End of Session - 07/03/22 0931     Visit Number 5    Number of Visits 17    Date for PT Re-Evaluation 08/02/22    Authorization Type Medicare AB    Progress Note Due on Visit 10    PT Start Time 0931    PT Stop Time 1015    PT Time Calculation (min) 44 min    Activity Tolerance Patient tolerated treatment well;No increased pain    Behavior During Therapy [facility] for tasks assessed/performed                History reviewed. No pertinent past medical history. Past Surgical History:  Procedure Laterality Date   JOINT REPLACEMENT     There are no problems to display for this patient.   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 No other new updates  PAIN:  Are you having pain: yes, 1/10 Location: medial L knee How would you describe your pain? Sharp, deep Best in past week: 0/10 Worst in past week: 3-4/10 Aggravating factors: walking >21min, transfers, lower body dressing, lifting/carrying, prolonged sitting/driving Easing factors: ice, elevated   OBJECTIVE: (objective measures completed at initial evaluation unless otherwise dated)   DIAGNOSTIC FINDINGS:  None in chart     PATIENT SURVEYS:  FOTO 49   COGNITION:           Overall cognitive status: Within functional  limits for tasks assessed                          SENSATION: Does not endorse any sensory deficits   EDEMA:  Generalized swelling L knee joint - figure 8 deferred as pt arrives with dressing on incision - appears clean and intact, no concerns.       PALPATION: Palpable tightness L quad/TFL and hip musculature   LOWER EXTREMITY ROM:   Active/Passive ROM Right eval Left eval Left 06/26/22 Left 07/03/22  Knee flexion WFL 78/85 90/92 95/96   Knee extension 0 -8/-5 0/0 (subjective tightness hamstrings) 0/0 mild subjective tightness   (Blank rows = not tested)   Comments: Performed passive physiological movement flex/extension of L knee with gentle oscillations to reduce muscle guarding, pt reports good relief       LOWER EXTREMITY MMT:     MMT Right eval Left eval  Hip flexion 4 4  Hip abduction (modified sitting) 5 5  Knee flexion      Knee extension       (Blank rows = not tested)   Comments: strain with L hip flexion, no pain. Knee MMT deferred given proximity to surgery     FUNCTIONAL TESTS:  5 times sit to stand: 14 sec STS   - standard chair, no UE support. Significant anterior positioning of LLE and weight shift to RLE noted  GAIT: Distance walked: within clinic Assistive device utilized: Single point cane Level of assistance: Modified independence Comments: partial step through pattern, SPC in RUE, fwd flexed posture, reduced stance time and step length on LLE       TODAY'S TREATMENT: [facility] Adult PT Treatment:                                                DATE: 07/03/22 Therapeutic Exercise: Bike half revolutions for tissue extensibility during subjective STS from chair with 2 airex, 5# KB 3x5, cues for ascent velocity for inc power output Machine hamstring curl 10#, 3x10 RLE used for support but most emphasis on LLE dynadisc lunge 2x8 w UE support as needed  Therapeutic Activity: STS from chair + airex, mirror to improve weight shift towards  surgical limb, 2x12 4inch step fwd lunge for increased knee flexion 2x12 w 5sec hold   [facility] Adult PT Treatment:                                                DATE: 06/26/22 Therapeutic Exercise: Propped quad set 2x15 LAQ 3# 2x12 STS 2x8 from raised mat with 5#KB Fwd airex lunge 2x12, floating UE support as needed Squat with B UE support at cable column, cues for ROM and weight shift 2x10 4inch step fwd lunge for increased knee flexion 4x5 w 5sec hold Standing heel raise 2x12  Manual Therapy: Reclined + supine: soft tissue mobilization to quad, TFL, hamstring passive physiological movement to knee joint flex/ext within tolerated ROM, oscillations for reduced muscle guarding   [facility] Adult PT Treatment:                                                DATE: 06/19/22 Therapeutic Exercise: Seated heel slide knee flex x20, strap + slider, cues for hold and breath control Quad set 3x10 propped on towel SLR 3x12 cues for pacing and full knee extension Bridge 2x10 cues for form STS 2x10 from raised mat, cues for symmetrical WB Fwd lunge onto airex 3x8, UE support as needed  Manual Therapy: Reclined + supine: soft tissue mobilization to quad, TFL, hamstring passive physiological movement to knee joint flex/ext within tolerated ROM, oscillations for reduced muscle guarding Contract relax hamstrings 2x5 towards full knee ext with hip at 45 deg flex      PATIENT EDUCATION:  Education details: HEP, rationale for interventions throughout Person educated: Patient Education method: Explanation, Demonstration, Tactile cues, Verbal cues Education comprehension: verbalized understanding, returned demonstration, verbal cues required, tactile cues required, and needs further education      HOME EXERCISE PROGRAM: Access Code: [email or web address] URL: [email or web address] Date: 06/04/2022 Prepared by: Fransisco Hertz   Exercises - Supine Quad Set  - 1 x daily - 7 x weekly - 3 sets - 10 reps -  Seated Long Arc Quad  - 1 x daily - 7 x weekly - 3 sets - 10 reps - Seated Knee Flexion Extension AROM (seated heel slide with strap)  - 1 x daily - 7 x weekly - 3 sets - 10 reps   ASSESSMENT:  CLINICAL IMPRESSION: Patient is a 66 y.o. gentleman who was seen today for physical therapy treatment for L knee s/p TKA on 05/22/22. Pt arrives with minimal pain, weaned manual on this date in favor of bike with half revolutions for increased focus on knee flexion ROM. Followed with program emphasizing quad strength/power and closed/open chain knee flexion. Pt tolerates session quite well, notes most difficulty with half revolutions on bike. Cues as needed for form and appropriate performance. Pt departs with no increase in pain. Measurements taken at 95/96 deg for active/passive knee flex and 0 deg for knee extension, good SLR noted with no lag - pt states he follows up w surgeon tomorrow and will relay progress. Pt departs today's session in no acute distress, all voiced questions/concerns addressed appropriately from PT perspective.     Recommend skilled PT to address aforementioned deficits to maximize functional independence and tolerance. Pt departs today's session in no acute distress, all voiced questions/concerns addressed appropriately from PT perspective.      OBJECTIVE IMPAIRMENTS Abnormal gait, decreased activity tolerance, decreased endurance, decreased mobility, difficulty walking, decreased ROM, decreased strength, hypomobility, increased edema, and pain.    ACTIVITY LIMITATIONS carrying, lifting, bending, standing, squatting, transfers, and dressing   PARTICIPATION LIMITATIONS: driving, community activity, and occupation   PERSONAL FACTORS  None  are also affecting patient's functional outcome.    REHAB POTENTIAL: Good   CLINICAL DECISION MAKING: Stable/uncomplicated   EVALUATION COMPLEXITY: Low     GOALS: Goals reviewed with patient? Yes   SHORT TERM GOALS: Target date:  07/02/2022  Pt will demonstrate appropriate understanding and performance of initially prescribed HEP in order to facilitate improved independence with management of symptoms.  Baseline: HEP provided on eval Goal status: INITIAL    2. Pt will score greater than or equal to 54 on FOTO in order to demonstrate improved perception of function due to symptoms.            Baseline: 49            Goal status: INITIAL    LONG TERM GOALS: Target date: 07/30/2022    Pt will score 62 or greater on FOTO in order to demonstrate improved perception of function due to symptoms.  Baseline: 49 Goal status: INITIAL   2.  Pt will demonstrate at least 0-120 degrees of L knee AROM in order to facilitate improved tolerance to functional movements such as walking and lifting.  Baseline: -8 ext, 78 flexion Goal status: INITIAL   3.  Pt will be able to lift up to 10# in order to demonstrate improved capacity for daily activities such as lifting/carrying drapery for work activities.  Baseline: NT due to proximity to surgery Goal status: INITIAL   4.  Pt will be able to perform 5xSTS in less than or equal to 11sec with appropriate mechanics in order to demonstrate reduced fall risk and improved functional independence (MCID 5xSTS = 2.3 sec). Baseline: 14 sec, with significant anterior placement of LLE Goal status: INITIAL     PLAN: PT FREQUENCY: 2x/week   PT DURATION: 8 weeks   PLANNED INTERVENTIONS: Therapeutic exercises, Therapeutic activity, Neuromuscular re-education, Balance training, Gait training, Patient/Family education, Self Care, Joint mobilization, Stair training, DME instructions, Dry Needling, Electrical stimulation, Cryotherapy, Moist heat, Vasopneumatic device, Manual therapy, and Re-evaluation   PLAN FOR NEXT SESSION: progress strength/stability training as able/appropriate, increased emphasis on hamstring and knee flexion     Ashley Murrain PT, DPT 07/03/2022 12:42 PM

## 2022-07-03 ENCOUNTER — Encounter: Payer: Self-pay | Admitting: Physical Therapy

## 2022-07-03 ENCOUNTER — Ambulatory Visit: Payer: Medicare Other | Admitting: Physical Therapy

## 2022-07-03 DIAGNOSIS — M25662 Stiffness of left knee, not elsewhere classified: Secondary | ICD-10-CM

## 2022-07-03 DIAGNOSIS — R609 Edema, unspecified: Secondary | ICD-10-CM

## 2022-07-03 DIAGNOSIS — M25562 Pain in left knee: Secondary | ICD-10-CM

## 2022-07-03 DIAGNOSIS — R2689 Other abnormalities of gait and mobility: Secondary | ICD-10-CM

## 2022-07-04 NOTE — Therapy (Signed)
OUTPATIENT PHYSICAL THERAPY TREATMENT NOTE   Patient Name: Levi Strickland MRN: [id number] DOB:May 01, 1956, 66 y.o., male Today's Date: 07/05/2022  PCP: none in chart   REFERRING PROVIDER: Bethann Berkshire MD  END OF SESSION:   PT End of Session - 07/05/22 0853     Visit Number 6    Number of Visits 17    Date for PT Re-Evaluation 08/02/22    Authorization Type Medicare AB    Progress Note Due on Visit 10    PT Start Time [phone]   pt late arrival   PT Stop Time 0928    PT Time Calculation (min) 35 min    Activity Tolerance Patient tolerated treatment well;No increased pain    Behavior During Therapy [facility] for tasks assessed/performed                 History reviewed. No pertinent past medical history. Past Surgical History:  Procedure Laterality Date   JOINT REPLACEMENT     There are no problems to display for this patient.   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 Pt states his MD appt was rescheduled for next week.   PAIN:  Are you having pain: yes, 0/10 Location: medial L knee How would you describe your pain? Sharp, deep Best in past week: 0/10 Worst in past week: 3-4/10 Aggravating factors: walking >68min, transfers, lower body dressing, lifting/carrying, prolonged sitting/driving Easing factors: ice, elevated   OBJECTIVE: (objective measures completed at initial evaluation unless otherwise dated)   DIAGNOSTIC FINDINGS:  None in chart     PATIENT  SURVEYS:  FOTO 49   COGNITION:           Overall cognitive status: Within functional limits for tasks assessed                          SENSATION: Does not endorse any sensory deficits   EDEMA:  Generalized swelling L knee joint - figure 8 deferred as pt arrives with dressing on incision - appears clean and intact, no concerns.       PALPATION: Palpable tightness L quad/TFL and hip musculature   LOWER EXTREMITY ROM:   Active/Passive ROM Right eval Left eval Left 06/26/22 Left 07/03/22  Knee flexion [facility] 78/85 90/92 95/96   Knee extension 0 -8/-5 0/0 (subjective tightness hamstrings) 0/0 mild subjective tightness   (Blank rows = not tested)   Comments: Performed passive physiological movement flex/extension of L knee with gentle oscillations to reduce muscle guarding, pt reports good relief       LOWER EXTREMITY MMT:     MMT Right eval Left eval  Hip flexion 4 4  Hip abduction (modified sitting) 5 5  Knee flexion      Knee extension       (Blank rows = not tested)   Comments: strain with L hip flexion, no pain. Knee MMT deferred given proximity to surgery     FUNCTIONAL TESTS:  5 times sit to stand: 14 sec STS   - standard chair,  no UE support. Significant anterior positioning of LLE and weight shift to RLE noted   GAIT: Distance walked: within clinic Assistive device utilized: Single point cane Level of assistance: Modified independence Comments: partial step through pattern, SPC in RUE, fwd flexed posture, reduced stance time and step length on LLE       TODAY'S TREATMENT: [facility] Adult PT Treatment:                                                DATE: 07/05/2022  Therapeutic Exercise: Bike 5 min half revolutions for tissue extensibility STS from chair + 2 airex, 5# KB 3x5 Machine hamstring curl 10#, 2x15 RLE for support, focus on LLE Heel raises 2x12 w UE support as needed  Therapeutic Activity: STS from chair + airex, 2x8 8inch step fwd lunge, 3x8,  emphasis on knee flexion ROM 4inch step up w/o UE support 2x10  [facility] Adult PT Treatment:                                                DATE: 07/03/22 Therapeutic Exercise: Bike 79min half revolutions for tissue extensibility during subjective STS from chair with 2 airex, 5# KB 3x5, cues for ascent velocity for inc power output Machine hamstring curl 10#, 3x10 RLE used for support but most emphasis on LLE dynadisc lunge 2x8 w UE support as needed  Therapeutic Activity: STS from chair + airex, mirror to improve weight shift towards surgical limb, 2x12 4inch step fwd lunge for increased knee flexion 2x12 w 5sec hold   [facility] Adult PT Treatment:                                                DATE: 06/26/22 Therapeutic Exercise: Propped quad set 2x15 LAQ 3# 2x12 STS 2x8 from raised mat with 5#KB Fwd airex lunge 2x12, floating UE support as needed Squat with B UE support at cable column, cues for ROM and weight shift 2x10 4inch step fwd lunge for increased knee flexion 4x5 w 5sec hold Standing heel raise 2x12  Manual Therapy: Reclined + supine: soft tissue mobilization to quad, TFL, hamstring passive physiological movement to knee joint flex/ext within tolerated ROM, oscillations for reduced muscle guarding       PATIENT EDUCATION:  Education details: HEP, rationale for interventions throughout Person educated: Patient Education method: Explanation, Demonstration, Tactile cues, Verbal cues Education comprehension: verbalized understanding, returned demonstration, verbal cues required, tactile cues required, and needs further education      HOME EXERCISE PROGRAM: Access Code: [email or web address] URL: [email or web address] Date: 06/04/2022 Prepared by: Enis Slipper   Exercises - Supine Quad Set  - 1 x daily - 7 x weekly - 3 sets - 10 reps - Seated Long Arc Quad  - 1 x daily - 7 x weekly - 3 sets - 10 reps - Seated Knee Flexion Extension AROM (seated heel slide with strap)  -  1 x daily - 7 x weekly - 3 sets - 10 reps   ASSESSMENT:   CLINICAL IMPRESSION: Patient is a 66 y.o. gentleman who was seen today for physical  therapy treatment for L knee s/p TKA on 05/22/22. Pt arrives with minimal pain, mild soreness after last session; today's session shortened due to late arrival. Pt continues to progress well for increased emphasis on knee flexion mobility/strength in open/closed chain. Tolerates session well with no overt increase in pain, most difficulty reported with stationary bike w/ half revolution and increased depth STS. Pt departs today's session in no acute distress, all voiced questions/concerns addressed appropriately from PT perspective.       OBJECTIVE IMPAIRMENTS Abnormal gait, decreased activity tolerance, decreased endurance, decreased mobility, difficulty walking, decreased ROM, decreased strength, hypomobility, increased edema, and pain.    ACTIVITY LIMITATIONS carrying, lifting, bending, standing, squatting, transfers, and dressing   PARTICIPATION LIMITATIONS: driving, community activity, and occupation   PERSONAL FACTORS  None  are also affecting patient's functional outcome.    REHAB POTENTIAL: Good   CLINICAL DECISION MAKING: Stable/uncomplicated   EVALUATION COMPLEXITY: Low     GOALS: Goals reviewed with patient? Yes   SHORT TERM GOALS: Target date: 07/02/2022  Pt will demonstrate appropriate understanding and performance of initially prescribed HEP in order to facilitate improved independence with management of symptoms.  Baseline: HEP provided on eval Goal status: INITIAL    2. Pt will score greater than or equal to 54 on FOTO in order to demonstrate improved perception of function due to symptoms.            Baseline: 49            Goal status: INITIAL    LONG TERM GOALS: Target date: 07/30/2022    Pt will score 62 or greater on FOTO in order to demonstrate improved perception of function due to symptoms.  Baseline: 49 Goal  status: INITIAL   2.  Pt will demonstrate at least 0-120 degrees of L knee AROM in order to facilitate improved tolerance to functional movements such as walking and lifting.  Baseline: -8 ext, 78 flexion Goal status: INITIAL   3.  Pt will be able to lift up to 10# in order to demonstrate improved capacity for daily activities such as lifting/carrying drapery for work activities.  Baseline: NT due to proximity to surgery Goal status: INITIAL   4.  Pt will be able to perform 5xSTS in less than or equal to 11sec with appropriate mechanics in order to demonstrate reduced fall risk and improved functional independence (MCID 5xSTS = 2.3 sec). Baseline: 14 sec, with significant anterior placement of LLE Goal status: INITIAL     PLAN: PT FREQUENCY: 2x/week   PT DURATION: 8 weeks   PLANNED INTERVENTIONS: Therapeutic exercises, Therapeutic activity, Neuromuscular re-education, Balance training, Gait training, Patient/Family education, Self Care, Joint mobilization, Stair training, DME instructions, Dry Needling, Electrical stimulation, Cryotherapy, Moist heat, Vasopneumatic device, Manual therapy, and Re-evaluation   PLAN FOR NEXT SESSION:  progress strength/stability training as able/appropriate, increased emphasis on hamstring and knee flexion     Leeroy Cha PT, DPT 07/05/2022 9:29 AM

## 2022-07-05 ENCOUNTER — Ambulatory Visit: Payer: Medicare Other | Admitting: Physical Therapy

## 2022-07-05 ENCOUNTER — Encounter: Payer: Self-pay | Admitting: Physical Therapy

## 2022-07-05 DIAGNOSIS — R2689 Other abnormalities of gait and mobility: Secondary | ICD-10-CM

## 2022-07-05 DIAGNOSIS — R609 Edema, unspecified: Secondary | ICD-10-CM

## 2022-07-05 DIAGNOSIS — M25562 Pain in left knee: Secondary | ICD-10-CM

## 2022-07-05 DIAGNOSIS — M25662 Stiffness of left knee, not elsewhere classified: Secondary | ICD-10-CM

## 2022-07-09 NOTE — Therapy (Signed)
OUTPATIENT PHYSICAL THERAPY TREATMENT NOTE   Patient Name: Levi Strickland MRN: [id number] DOB:02-13-1956, 66 y.o., male Today's Date: 07/10/2022  PCP: none in chart   REFERRING PROVIDER: Gwenlyn Found MD  END OF SESSION:   PT End of Session - 07/10/22 1554     Visit Number 7    Number of Visits 17    Date for PT Re-Evaluation 08/02/22    Authorization Type Medicare [employer]    Progress Note Due on Visit 10    PT Start Time 5366   pt late arrival   PT Stop Time 1628    PT Time Calculation (min) 34 min    Activity Tolerance Patient tolerated treatment well;No increased pain    Behavior During Therapy [facility] for tasks assessed/performed                  History reviewed. No pertinent past medical history. Past Surgical History:  Procedure Laterality Date   JOINT REPLACEMENT     There are no problems to display for this patient.   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 States HEP has been posed no problems although somewhat inconsistent performance due to being busy, follows up with MD tomorrow  PAIN:  Are you having pain: yes, 0/10 Location: medial L knee How would you describe your pain? Sharp, deep Best in past week: 0/10 Worst in past week: 3-4/10 Aggravating factors: walking >98min, transfers, lower body dressing, lifting/carrying, prolonged sitting/driving Easing factors: ice, elevated   OBJECTIVE: (objective measures completed at initial evaluation unless otherwise dated)   DIAGNOSTIC FINDINGS:  None in chart      PATIENT SURVEYS:  FOTO 49   COGNITION:           Overall cognitive status: Within functional limits for tasks assessed                          SENSATION: Does not endorse any sensory deficits   EDEMA:  Generalized swelling L knee joint - figure 8 deferred as pt arrives with dressing on incision - appears clean and intact, no concerns.       PALPATION: Palpable tightness L quad/TFL and hip musculature   LOWER EXTREMITY ROM:   Active/Passive ROM Right eval Left eval Left 06/26/22 Left 07/03/22 07/10/22 Left passive only  Knee flexion [facility] 78/85 90/92 95/96  98  Knee extension 0 -8/-5 0/0 (subjective tightness hamstrings) 0/0 mild subjective tightness    (Blank rows = not tested)   Comments: Performed passive physiological movement flex/extension of L knee with gentle oscillations to reduce muscle guarding, pt reports good relief       LOWER EXTREMITY MMT:     MMT Right eval Left eval  Hip flexion 4 4  Hip abduction (modified sitting) 5 5  Knee flexion      Knee extension       (Blank rows = not tested)   Comments: strain with L hip flexion, no pain. Knee MMT deferred given proximity to surgery     FUNCTIONAL TESTS:  5 times sit to stand: 14 sec STS   -  standard chair, no UE support. Significant anterior positioning of LLE and weight shift to RLE noted   GAIT: Distance walked: within clinic Assistive device utilized: Single point cane Level of assistance: Modified independence Comments: partial step through pattern, SPC in RUE, fwd flexed posture, reduced stance time and step length on LLE       TODAY'S TREATMENT: [facility] Adult PT Treatment:                                                DATE: 07/10/22 Therapeutic Exercise: Bike half revolutions for tissue extensibility Machine hamstring curl 10# x10, 15# 2x8 STS chair + airex 2x10 Supine heel slide with strap x10 10sec hold  LAQ 2x8 4# ankle weight cues for reduced compensations at trunk and hip Super  set 4inch step ups x8, 8inch weight shifts + push downs x8, 2 laps    [facility] Adult PT Treatment:                                                DATE: 07/05/2022  Therapeutic Exercise: Bike 5 min half revolutions for tissue extensibility STS from chair + 2 airex, 5# KB 3x5 Machine hamstring curl 10#, 2x15 RLE for support, focus on LLE Heel raises 2x12 w UE support as needed  Therapeutic Activity: STS from chair + airex, 2x8 8inch step fwd lunge, 3x8, emphasis on knee flexion ROM 4inch step up w/o UE support 2x10  [facility] Adult PT Treatment:                                                DATE: 07/03/22 Therapeutic Exercise: Bike half revolutions for tissue extensibility during subjective STS from chair with 2 airex, 5# KB 3x5, cues for ascent velocity for inc power output Machine hamstring curl 10#, 3x10 RLE used for support but most emphasis on LLE dynadisc lunge 2x8 w UE support as needed  Therapeutic Activity: STS from chair + airex, mirror to improve weight shift towards surgical limb, 2x12 4inch step fwd lunge for increased knee flexion 2x12 w 5sec hold  PATIENT EDUCATION:  Education details: HEP, rationale for interventions throughout Person educated: Patient Education method: Explanation, Demonstration, Tactile cues, Verbal cues Education comprehension: verbalized understanding, returned demonstration, verbal cues required, tactile cues required, and needs further education      HOME EXERCISE PROGRAM: Access Code: [email or web address] URL: [email or web address] Date: 06/04/2022 Prepared by: Fransisco Hertz   Exercises - Supine Quad Set  - 1 x daily - 7 x weekly - 3 sets - 10 reps - Seated Long Arc Quad  - 1 x daily - 7 x weekly - 3 sets - 10 reps - Seated Knee Flexion Extension AROM (seated heel slide with strap)  - 1 x daily - 7 x weekly - 3 sets - 10 reps   ASSESSMENT:   CLINICAL IMPRESSION: Patient is a 66 y.o. gentleman who was seen today for physical therapy  treatment for L knee s/p TKA on 05/22/22. Pt arrives without any complaints, session shortened due to late arrival. Pt tolerates session well with emphasis on hamstring  strengthening and knee flexion mobility. Active/passive knee flexion remains greatest limitation. Pt tolerates session well without increase in pain, primary report of muscular fatigue. Pt departs today's session in no acute distress, all voiced questions/concerns addressed appropriately from PT perspective.      OBJECTIVE IMPAIRMENTS Abnormal gait, decreased activity tolerance, decreased endurance, decreased mobility, difficulty walking, decreased ROM, decreased strength, hypomobility, increased edema, and pain.    ACTIVITY LIMITATIONS carrying, lifting, bending, standing, squatting, transfers, and dressing   PARTICIPATION LIMITATIONS: driving, community activity, and occupation   PERSONAL FACTORS  None  are also affecting patient's functional outcome.    REHAB POTENTIAL: Good   CLINICAL DECISION MAKING: Stable/uncomplicated   EVALUATION COMPLEXITY: Low     GOALS: Goals reviewed with patient? Yes   SHORT TERM GOALS: Target date: 07/02/2022  Pt will demonstrate appropriate understanding and performance of initially prescribed HEP in order to facilitate improved independence with management of symptoms.  Baseline: HEP provided on eval Goal status: INITIAL    2. Pt will score greater than or equal to 54 on FOTO in order to demonstrate improved perception of function due to symptoms.            Baseline: 49            Goal status: INITIAL    LONG TERM GOALS: Target date: 07/30/2022    Pt will score 62 or greater on FOTO in order to demonstrate improved perception of function due to symptoms.  Baseline: 49 Goal status: INITIAL   2.  Pt will demonstrate at least 0-120 degrees of L knee AROM in order to facilitate improved tolerance to functional movements such as walking and lifting.  Baseline: -8 ext, 78 flexion Goal  status: INITIAL   3.  Pt will be able to lift up to 10# in order to demonstrate improved capacity for daily activities such as lifting/carrying drapery for work activities.  Baseline: NT due to proximity to surgery Goal status: INITIAL   4.  Pt will be able to perform 5xSTS in less than or equal to 11sec with appropriate mechanics in order to demonstrate reduced fall risk and improved functional independence (MCID 5xSTS = 2.3 sec). Baseline: 14 sec, with significant anterior placement of LLE Goal status: INITIAL     PLAN: PT FREQUENCY: 2x/week   PT DURATION: 8 weeks   PLANNED INTERVENTIONS: Therapeutic exercises, Therapeutic activity, Neuromuscular re-education, Balance training, Gait training, Patient/Family education, Self Care, Joint mobilization, Stair training, DME instructions, Dry Needling, Electrical stimulation, Cryotherapy, Moist heat, Vasopneumatic device, Manual therapy, and Re-evaluation   PLAN FOR NEXT SESSION:  progress strength/stability training as able/appropriate, increased emphasis on hamstring and knee flexion     Ashley Murrain PT, DPT 07/10/2022 4:30 PM

## 2022-07-10 ENCOUNTER — Encounter: Payer: Self-pay | Admitting: Physical Therapy

## 2022-07-10 ENCOUNTER — Ambulatory Visit: Payer: Medicare Other | Admitting: Physical Therapy

## 2022-07-10 DIAGNOSIS — M25562 Pain in left knee: Secondary | ICD-10-CM | POA: Diagnosis not present

## 2022-07-10 DIAGNOSIS — R2689 Other abnormalities of gait and mobility: Secondary | ICD-10-CM

## 2022-07-10 DIAGNOSIS — M25662 Stiffness of left knee, not elsewhere classified: Secondary | ICD-10-CM

## 2022-07-10 DIAGNOSIS — R609 Edema, unspecified: Secondary | ICD-10-CM

## 2022-07-12 ENCOUNTER — Telehealth: Payer: Self-pay | Admitting: Physical Therapy

## 2022-07-12 ENCOUNTER — Ambulatory Visit: Payer: Medicare Other | Admitting: Physical Therapy

## 2022-07-12 NOTE — Telephone Encounter (Signed)
Called patient re: no show for 10:15 appt - no answer, left voice mail. Confirmed next appt and advised on attendance policy

## 2022-07-18 ENCOUNTER — Encounter: Payer: Medicare Other | Admitting: Physical Therapy

## 2022-07-19 ENCOUNTER — Ambulatory Visit: Payer: Medicare Other | Admitting: Physical Therapy

## 2022-07-22 NOTE — Therapy (Incomplete)
OUTPATIENT PHYSICAL THERAPY TREATMENT NOTE   Patient Name: Levi Strickland MRN: [id number] DOB:13-Jul-1956, 66 y.o., male Today's Date: 07/22/2022  PCP: none in chart   REFERRING PROVIDER: Gwenlyn Found MD  END OF SESSION:          No past medical history on file. Past Surgical History:  Procedure Laterality Date   JOINT REPLACEMENT     There are no problems to display for this patient.   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  Rationale for Evaluation and Treatment Rehabilitation  PERTINENT HISTORY: Previous MSK surgeries  PRECAUTIONS: s/p TKA 05/22/22  SUBJECTIVE:  *** DO FOTO on bike  Pt arrives with no significant pain, no significant soreness after last sessions. States HEP has been posed no problems although somewhat inconsistent performance due to being busy, follows up with MD tomorrow  PAIN:  Are you having pain: yes, 0/10 Location: medial L knee How would you describe your pain? Sharp, deep Best in past week: 0/10 Worst in past week: 3-4/10 Aggravating factors: walking >65min, transfers, lower body dressing, lifting/carrying, prolonged sitting/driving Easing factors: ice, elevated   OBJECTIVE: (objective measures completed at initial evaluation unless otherwise dated)   DIAGNOSTIC FINDINGS:  None in chart     PATIENT SURVEYS:  FOTO 49 FOTO 07/23/22: ***    COGNITION:           Overall cognitive status: Within functional limits for tasks assessed                          SENSATION: Does not endorse any sensory deficits   EDEMA:  Generalized swelling L knee joint - figure 8 deferred as pt arrives with dressing on incision - appears clean and intact, no concerns.       PALPATION: Palpable tightness L quad/TFL and hip musculature   LOWER EXTREMITY ROM:   Active/Passive ROM Right eval Left eval Left 06/26/22 Left 07/03/22 07/10/22 Left passive only  Knee  flexion WFL 78/85 90/92 95/96  98  Knee extension 0 -8/-5 0/0 (subjective tightness hamstrings) 0/0 mild subjective tightness    (Blank rows = not tested)   Comments: Performed passive physiological movement flex/extension of L knee with gentle oscillations to reduce muscle guarding, pt reports good relief       LOWER EXTREMITY MMT:     MMT Right eval Left eval  Hip flexion 4 4  Hip abduction (modified sitting) 5 5  Knee flexion      Knee extension       (Blank rows = not tested)   Comments: strain with L hip flexion, no pain. Knee MMT deferred given proximity to surgery     FUNCTIONAL TESTS:  5 times sit to stand: 14 sec STS   - standard chair, no UE support. Significant anterior positioning of LLE and weight shift to RLE noted   GAIT: Distance walked: within clinic Assistive device utilized: Single point cane Level of assistance: Modified independence Comments: partial step through pattern, SPC in RUE, fwd flexed posture, reduced stance time and step length on LLE       TODAY'S TREATMENT: [facility] Adult PT Treatment:                                                DATE:  07/23/22 Therapeutic Exercise: Bike 5 min half revolutions for tissue extensibility Machine hamstring curl 15# 3x8 LLE focus STS chair + airex 2x12 Supine heel slide w strap x10 10 sec hold Thomas stretch LLE only 3x30sec w strap assist LAQ 3x8 4# ankle weight, cues for form Manual Therapy: Knee flexion/ext passive physiological movement  with grade 2-3 joint mobilizations AP/PA, oscillations to reduce muscle guarding Contract/relax hamstring 5x5 LLE only  [facility] Adult PT Treatment:                                                DATE: 07/10/22 Therapeutic Exercise: Bike half revolutions for tissue extensibility Machine hamstring curl 10# x10, 15# 2x8 STS chair + airex 2x10 Supine heel slide with strap x10 10sec hold  LAQ 2x8 4# ankle weight cues for reduced compensations at trunk and hip Super set  4inch step ups x8, 8inch weight shifts + push downs x8, 2 laps    [facility] Adult PT Treatment:                                                DATE: 07/05/2022  Therapeutic Exercise: Bike 5 min half revolutions for tissue extensibility STS from chair + 2 airex, 5# KB 3x5 Machine hamstring curl 10#, 2x15 RLE for support, focus on LLE Heel raises 2x12 w UE support as needed  Therapeutic Activity: STS from chair + airex, 2x8 8inch step fwd lunge, 3x8, emphasis on knee flexion ROM 4inch step up w/o UE support 2x10  PATIENT EDUCATION:  Education details: HEP, rationale for interventions throughout Person educated: Patient Education method: Explanation, Demonstration, Tactile cues, Verbal cues Education comprehension: verbalized understanding, returned demonstration, verbal cues required, tactile cues required, and needs further education      HOME EXERCISE PROGRAM: Access Code: [email or web address] URL: [email or web address] Date: 06/04/2022 Prepared by: Fransisco Hertz   Exercises - Supine Quad Set  - 1 x daily - 7 x weekly - 3 sets - 10 reps - Seated Long Arc Quad  - 1 x daily - 7 x weekly - 3 sets - 10 reps - Seated Knee Flexion Extension AROM (seated heel slide with strap)  - 1 x daily - 7 x weekly - 3 sets - 10 reps   ASSESSMENT:   CLINICAL IMPRESSION: Patient is a 66 y.o. gentleman who was seen today for physical therapy treatment for L knee s/p TKA on 05/22/22. Pt arrives without significant pain. Session initiated with FOTO on bike, scored at *** which is *** compared to 49 initially. Reintroduced manual today with emphasis on improving knee flexion and general tissue extensibility around knee joint as pt continues to have greatest difficulty with knee flexion ROM. Able to progress LE strengthening program for increased volume/resistance with good tolerance. Pt departs today's session in no acute distress, all voiced questions/concerns addressed appropriately from PT perspective.     *** Pt arrives without any complaints, session shortened due to late arrival. Pt tolerates session well with emphasis on hamstring strengthening and knee flexion mobility. Active/passive knee flexion remains greatest limitation. Pt tolerates session well without increase in pain, primary report of muscular fatigue. Pt departs today's session in no acute distress, all voiced questions/concerns addressed appropriately from PT perspective.  OBJECTIVE IMPAIRMENTS Abnormal gait, decreased activity tolerance, decreased endurance, decreased mobility, difficulty walking, decreased ROM, decreased strength, hypomobility, increased edema, and pain.    ACTIVITY LIMITATIONS carrying, lifting, bending, standing, squatting, transfers, and dressing   PARTICIPATION LIMITATIONS: driving, community activity, and occupation   PERSONAL FACTORS  None  are also affecting patient's functional outcome.    REHAB POTENTIAL: Good   CLINICAL DECISION MAKING: Stable/uncomplicated   EVALUATION COMPLEXITY: Low     GOALS: Goals reviewed with patient? Yes   SHORT TERM GOALS: Target date: 07/02/2022  Pt will demonstrate appropriate understanding and performance of initially prescribed HEP in order to facilitate improved independence with management of symptoms.  Baseline: HEP provided on eval Goal status: INITIAL    2. Pt will score greater than or equal to 54 on FOTO in order to demonstrate improved perception of function due to symptoms.            Baseline: 49            Goal status: INITIAL    LONG TERM GOALS: Target date: 07/30/2022    Pt will score 62 or greater on FOTO in order to demonstrate improved perception of function due to symptoms.  Baseline: 49 Goal status: INITIAL   2.  Pt will demonstrate at least 0-120 degrees of L knee AROM in order to facilitate improved tolerance to functional movements such as walking and lifting.  Baseline: -8 ext, 78 flexion Goal status: INITIAL   3.  Pt will be  able to lift up to 10# in order to demonstrate improved capacity for daily activities such as lifting/carrying drapery for work activities.  Baseline: NT due to proximity to surgery Goal status: INITIAL   4.  Pt will be able to perform 5xSTS in less than or equal to 11sec with appropriate mechanics in order to demonstrate reduced fall risk and improved functional independence (MCID 5xSTS = 2.3 sec). Baseline: 14 sec, with significant anterior placement of LLE Goal status: INITIAL     PLAN: PT FREQUENCY: 2x/week   PT DURATION: 8 weeks   PLANNED INTERVENTIONS: Therapeutic exercises, Therapeutic activity, Neuromuscular re-education, Balance training, Gait training, Patient/Family education, Self Care, Joint mobilization, Stair training, DME instructions, Dry Needling, Electrical stimulation, Cryotherapy, Moist heat, Vasopneumatic device, Manual therapy, and Re-evaluation   PLAN FOR NEXT SESSION:  *** progress strength/stability training as able/appropriate, increased emphasis on hamstring and knee flexion     Ashley Murrain PT, DPT 07/22/2022 9:58 AM

## 2022-07-23 ENCOUNTER — Ambulatory Visit: Payer: Medicare Other | Admitting: Physical Therapy

## 2022-07-24 NOTE — Therapy (Signed)
OUTPATIENT PHYSICAL THERAPY TREATMENT NOTE   Patient Name: Levi Strickland MRN: [id number] DOB:[birth year], 66 y.o., male Today's Date: 07/25/2022  PCP: none in chart   REFERRING PROVIDER: Gwenlyn Found MD  END OF SESSION:   PT End of Session - 07/25/22 1415     Visit Number 8    Number of Visits 17    Date for PT Re-Evaluation 08/02/22    Authorization Type Medicare AB    Progress Note Due on Visit 10    PT Start Time 2229    PT Stop Time 1458    PT Time Calculation (min) 42 min    Activity Tolerance Patient tolerated treatment well;No increased pain    Behavior During Therapy [facility] for tasks assessed/performed                   History reviewed. No pertinent past medical history. Past Surgical History:  Procedure Laterality Date   JOINT REPLACEMENT     There are no problems to display for this patient.   REFERRING DIAG: aftercare following left knee joint replacement surgery  THERAPY DIAG:  Left knee pain, unspecified chronicity  Stiffness of left knee, not elsewhere classified  Other abnormalities of gait and mobility  Edema, unspecified type  Rationale for Evaluation and Treatment Rehabilitation  PERTINENT HISTORY: Previous MSK surgeries  PRECAUTIONS: s/p TKA 05/22/22  SUBJECTIVE:  Pt denies any significant pain at present, notes that he has been very busy with work and feels it is hindering his progress with his knee. Otherwise no new complaints, states he followed up with MD and they had no concerns about progress. States he would like to continue with PT to progress knee mobility  PAIN:  Are you having pain: yes, 0/10 Location: medial L knee How would you describe your pain? Sharp, deep Best in past week: 0/10 Worst in past week: 3-4/10 Aggravating factors: walking >38mn, transfers, lower body dressing, lifting/carrying, prolonged sitting/driving Easing factors: ice, elevated   OBJECTIVE: (objective measures completed at initial  evaluation unless otherwise dated)   DIAGNOSTIC FINDINGS:  None in chart     PATIENT SURVEYS:  FOTO 49 FOTO 07/25/22: 73%    COGNITION:           Overall cognitive status: Within functional limits for tasks assessed                          SENSATION: Does not endorse any sensory deficits   EDEMA:  Generalized swelling L knee joint - figure 8 deferred as pt arrives with dressing on incision - appears clean and intact, no concerns.       PALPATION: Palpable tightness L quad/TFL and hip musculature   LOWER EXTREMITY ROM:   Active/Passive ROM Right eval Left eval Left 07/03/22 07/10/22 Left passive only 07/25/22 L AROM/PROM  Knee flexion WFL 78/85 95/96 98 94/100  Knee extension 0 -8/-5 0/0 mild subjective tightness  WNL   (Blank rows = not tested)   Comments: Performed passive physiological movement flex/extension of L knee with gentle oscillations to reduce muscle guarding, pt reports good relief       LOWER EXTREMITY MMT:     MMT Right eval Left eval  Hip flexion 4 4  Hip abduction (modified sitting) 5 5  Knee flexion      Knee extension       (Blank rows = not tested)   Comments: strain with L hip flexion, no pain. Knee  MMT deferred given proximity to surgery     FUNCTIONAL TESTS:  5 times sit to stand: 14 sec STS   - standard chair, no UE support. Significant anterior positioning of LLE and weight shift to RLE noted   GAIT: Distance walked: within clinic Assistive device utilized: Single point cane Level of assistance: Modified independence Comments: partial step through pattern, SPC in RUE, fwd flexed posture, reduced stance time and step length on LLE       TODAY'S TREATMENT: [facility] Adult PT Treatment:                                                DATE: 07/25/22 Therapeutic Exercise: Bike 5 min half revolutions for tissue extensibility Prone knee flex stretch w/ strap 3x30sec Machine hamstring curl 15# 3x8 LLE only Machine knee ext 10# 2x10 LLE  only STS standard chair  2x10, no UE support, cues for fwd trunk lean  Manual Therapy: Knee flexion/ext passive physiological movement  with grade 2-3 joint mobilizations AP/PA, oscillations to reduce muscle guarding Contract/relax hamstring/quad 5x5 LLE only with knee flexion passive physiological movement  between sets  [facility] Adult PT Treatment:                                                DATE: 07/10/22 Therapeutic Exercise: Bike 73mn half revolutions for tissue extensibility Machine hamstring curl 10# x10, 15# 2x8 STS chair + airex 2x10 Supine heel slide with strap x10 10sec hold  LAQ 2x8 4# ankle weight cues for reduced compensations at trunk and hip Super set 4inch step ups x8, 8inch weight shifts + push downs x8, 2 laps    [facility] Adult PT Treatment:                                                DATE: 07/05/2022  Therapeutic Exercise: Bike 5 min half revolutions for tissue extensibility STS from chair + 2 airex, 5# KB 3x5 Machine hamstring curl 10#, 2x15 RLE for support, focus on LLE Heel raises 2x12 w UE support as needed  Therapeutic Activity: STS from chair + airex, 2x8 8inch step fwd lunge, 3x8, emphasis on knee flexion ROM 4inch step up w/o UE support 2x10  PATIENT EDUCATION:  Education details: HEP, rationale for interventions throughout Person educated: Patient Education method: Explanation, Demonstration, Tactile cues, Verbal cues Education comprehension: verbalized understanding, returned demonstration, verbal cues required, tactile cues required, and needs further education      HOME EXERCISE PROGRAM: Access Code: [email or web address]: [email or web address] Date: 06/04/2022 Prepared by: DEnis Slipper  Exercises - Supine Quad Set  - 1 x daily - 7 x weekly - 3 sets - 10 reps - Seated Long Arc Quad  - 1 x daily - 7 x weekly - 3 sets - 10 reps - Seated Knee Flexion Extension AROM (seated heel slide with strap)  - 1 x daily - 7 x weekly - 3 sets - 10  reps   ASSESSMENT:   CLINICAL IMPRESSION: Patient is a 66y.o. gentleman who was seen today for physical therapy treatment for L knee  s/p TKA on 05/22/22. Pt arrives without significant pain. Session initiated with FOTO on bike, scored at 73% which is significantly improved compared to 49 initially and better than predicted final score. Reintroduced manual today with emphasis on improving knee flexion and general tissue extensibility around knee joint as pt continues to have greatest difficulty with knee flexion ROM. Able to progress LE strengthening program for increased volume/resistance with good tolerance. Pt reports excellent improvement in stiffness as session goes on, noted improvement in knee flexion ROM as above. No adverse events, pt tolerates session well. Pt departs today's session in no acute distress, all voiced questions/concerns addressed appropriately from PT perspective.      OBJECTIVE IMPAIRMENTS Abnormal gait, decreased activity tolerance, decreased endurance, decreased mobility, difficulty walking, decreased ROM, decreased strength, hypomobility, increased edema, and pain.    ACTIVITY LIMITATIONS carrying, lifting, bending, standing, squatting, transfers, and dressing   PARTICIPATION LIMITATIONS: driving, community activity, and occupation   PERSONAL FACTORS  None  are also affecting patient's functional outcome.    REHAB POTENTIAL: Good   CLINICAL DECISION MAKING: Stable/uncomplicated   EVALUATION COMPLEXITY: Low     GOALS: Goals reviewed with patient? Yes   SHORT TERM GOALS: Target date: 07/02/2022  Pt will demonstrate appropriate understanding and performance of initially prescribed HEP in order to facilitate improved independence with management of symptoms.  Baseline: HEP provided on eval 07/25/22: good performance, limited compliance d/t business Goal status: MET   2. Pt will score greater than or equal to 54 on FOTO in order to demonstrate improved perception  of function due to symptoms.            Baseline: 49  07/25/22: 73%            Goal status: MET   LONG TERM GOALS: Target date: 07/30/2022    Pt will score 62 or greater on FOTO in order to demonstrate improved perception of function due to symptoms.  Baseline: 49 07/25/22: 73% Goal status: MET   2.  Pt will demonstrate at least 0-120 degrees of L knee AROM in order to facilitate improved tolerance to functional movements such as walking and lifting.  Baseline: -8 ext, 78 flexion 07/25/22: 0 ext, 94 flexion (actively, 100deg passively) Goal status: ONGOING   3.  Pt will be able to lift up to 10# in order to demonstrate improved capacity for daily activities such as lifting/carrying drapery for work activities.  Baseline: NT due to proximity to surgery Goal status: INITIAL   4.  Pt will be able to perform 5xSTS in less than or equal to 11sec with appropriate mechanics in order to demonstrate reduced fall risk and improved functional independence (MCID 5xSTS = 2.3 sec). Baseline: 14 sec, with significant anterior placement of LLE Goal status: INITIAL     PLAN: PT FREQUENCY: 2x/week   PT DURATION: 8 weeks   PLANNED INTERVENTIONS: Therapeutic exercises, Therapeutic activity, Neuromuscular re-education, Balance training, Gait training, Patient/Family education, Self Care, Joint mobilization, Stair training, DME instructions, Dry Needling, Electrical stimulation, Cryotherapy, Moist heat, Vasopneumatic device, Manual therapy, and Re-evaluation   PLAN FOR NEXT SESSION:   progress strength/stability training as able/appropriate, increased emphasis on hamstring and knee flexion. Assess appropriateness for functional lifting     Leeroy Cha PT, DPT 07/25/2022 3:05 PM

## 2022-07-25 ENCOUNTER — Encounter: Payer: Self-pay | Admitting: Physical Therapy

## 2022-07-25 ENCOUNTER — Ambulatory Visit: Payer: Medicare Other | Attending: Family Medicine | Admitting: Physical Therapy

## 2022-07-25 DIAGNOSIS — R2689 Other abnormalities of gait and mobility: Secondary | ICD-10-CM | POA: Insufficient documentation

## 2022-07-25 DIAGNOSIS — M25562 Pain in left knee: Secondary | ICD-10-CM | POA: Insufficient documentation

## 2022-07-25 DIAGNOSIS — R609 Edema, unspecified: Secondary | ICD-10-CM | POA: Diagnosis present

## 2022-07-25 DIAGNOSIS — M25662 Stiffness of left knee, not elsewhere classified: Secondary | ICD-10-CM | POA: Diagnosis present

## 2022-07-26 ENCOUNTER — Ambulatory Visit: Payer: Medicare Other | Admitting: Physical Therapy

## 2022-07-31 ENCOUNTER — Ambulatory Visit: Payer: Medicare Other

## 2022-08-01 ENCOUNTER — Ambulatory Visit: Payer: Medicare Other | Admitting: Physical Therapy

## 2022-08-01 NOTE — Therapy (Signed)
OUTPATIENT PHYSICAL THERAPY TREATMENT NOTE   Patient Name: Levi Strickland MRN: [id number] DOB:09/30/1955, 66 y.o., male Today's Date: 08/02/2022  PCP: none in chart   REFERRING PROVIDER: Gwenlyn Found MD  END OF SESSION:   PT End of Session - 08/02/22 1153     Visit Number 9    Number of Visits 17    Date for PT Re-Evaluation 08/02/22    Authorization Type Medicare AB    Progress Note Due on Visit 10    PT Start Time 1152    PT Stop Time 1230    PT Time Calculation (min) 38 min    Activity Tolerance Patient tolerated treatment well;No increased pain    Behavior During Therapy [facility] for tasks assessed/performed                    History reviewed. No pertinent past medical history. Past Surgical History:  Procedure Laterality Date   JOINT REPLACEMENT     There are no problems to display for this patient.   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  He had to reschedule last appointment due to work.  He has been doing his exercises. Had a video visit with MD ([place].)  PAIN:  Are you having pain: stiffness, no pain  0/10 Location: medial L knee How would you describe your pain? Sharp, deep Best in past week: 0/10 Worst in past week: 3-4/10 Aggravating factors: walking >11mn, transfers, lower body dressing, lifting/carrying, prolonged sitting/driving Easing factors: ice, elevated   OBJECTIVE: (objective measures completed at initial evaluation unless otherwise dated)   DIAGNOSTIC FINDINGS:  None  in chart     PATIENT SURVEYS:  FOTO 49 FOTO 07/25/22: 73%    COGNITION:           Overall cognitive status: Within functional limits for tasks assessed                          SENSATION: Does not endorse any sensory deficits   EDEMA:  Generalized swelling L knee joint - figure 8 deferred as pt arrives with dressing on incision - appears clean and intact, no concerns.       PALPATION: Palpable tightness L quad/TFL and hip musculature   LOWER EXTREMITY ROM:   Active/Passive ROM Right eval Left eval Left 07/03/22 07/10/22 Left passive only 07/25/22 L AROM/PROM  Knee flexion WFL 78/85 95/96 98 94/100  Knee extension 0 -8/-5 0/0 mild subjective tightness  WNL   (Blank rows = not tested)   Comments: Performed passive physiological movement flex/extension of L knee with gentle oscillations to reduce muscle guarding, pt reports good relief       LOWER EXTREMITY MMT:     MMT Right eval Left eval Lt 08/02/22  Hip flexion _0 Hip abduction (modified sitting) 5 5 4+  Knee flexion     5  Knee extension     5   (Blank rows = not tested)   Comments: strain with L hip flexion, no pain. Knee MMT deferred given  proximity to surgery     FUNCTIONAL TESTS:  5 times sit to stand: 14 sec STS   - standard chair, no UE support. Significant anterior positioning of LLE and weight shift to RLE noted   GAIT: Distance walked: within clinic Assistive device utilized: Single point cane Level of assistance: Modified independence Comments: partial step through pattern, SPC in RUE, fwd flexed posture, reduced stance time and step length on LLE       TODAY'S TREATMENT:   [facility] Adult PT Treatment:                                                DATE: 08/02/22 Therapeutic Exercise: Recumbent bike 5 min 1/2 revs, level 1  Knee flexion AROM supine , then with strap Bridging on mat x 10, hamstring bridge x 10 heels on bolster Prone stretch with strap x 30 sec x 5  Prone knee flexion 4 lbs  cuff weight x 20  Prone hip ext 4 lbs  x 10  Sit to stand x 10 lbs x 15 , x 10 with wgt on LLE  Wall squats with mirror feedback to incr. WB on LLE x 2 x 10   [facility] Adult PT Treatment:                                                DATE: 07/25/22 Therapeutic Exercise: Bike 5 min half revolutions for tissue extensibility Prone knee flex stretch w/ strap 3x30sec Machine hamstring curl 15# 3x8 LLE only Machine knee ext 10# 2x10 LLE only STS standard chair  2x10, no UE support, cues for fwd trunk lean  Manual Therapy: Knee flexion/ext passive physiological movement  with grade 2-3 joint mobilizations AP/PA, oscillations to reduce muscle guarding Contract/relax hamstring/quad 5x5 LLE only with knee flexion passive physiological movement  between sets  [facility] Adult PT Treatment:                                                DATE: 07/10/22 Therapeutic Exercise: Bike 39mn half revolutions for tissue extensibility Machine hamstring curl 10# x10, 15# 2x8 STS chair + airex 2x10 Supine heel slide with strap x10 10sec hold  LAQ 2x8 4# ankle weight cues for reduced compensations at trunk and hip Super set 4inch step ups x8, 8inch weight shifts + push downs x8, 2 laps    [facility] Adult PT Treatment:                                                DATE: 07/05/2022  Therapeutic Exercise: Bike 5 min half revolutions for tissue extensibility STS from chair + 2 airex, 5# KB 3x5 Machine hamstring curl 10#, 2x15 RLE for support, focus on LLE Heel raises 2x12 w UE support as needed  Therapeutic Activity: STS from chair + airex, 2x8 8inch step fwd lunge, 3x8, emphasis on knee flexion ROM 4inch step up w/o UE support 2x10  PATIENT EDUCATION:  Education details:  HEP, rationale for interventions throughout, AROM and progress.  Person educated: Patient Education method: Explanation, Demonstration, Tactile cues, Verbal cues Education comprehension: verbalized understanding, returned demonstration, verbal cues  required, tactile cues required, and needs further education      HOME EXERCISE PROGRAM: Access Code: [email or web address] URL: [email or web address] Date: 06/04/2022 Prepared by: Enis Slipper   Exercises - Supine Quad Set  - 1 x daily - 7 x weekly - 3 sets - 10 reps - Seated Long Arc Quad  - 1 x daily - 7 x weekly - 3 sets - 10 reps - Seated Knee Flexion Extension AROM (seated heel slide with strap)  - 1 x daily - 7 x weekly - 3 sets - 10 reps  ADDED 08/02/22: PRONE knee flexion, bridging  ASSESSMENT:   CLINICAL IMPRESSION: Patient reported today without major complaints.  As stated last session he has improved his range of motion and objective measures.  He does feel like he would benefit from more physical therapy.  He has 0 degrees extension on left knee which is great.  Left knee actively gets to about 95 degrees.  He continues to exhibit hip extension weakness and mild antalgic gait.  His HEP was updated. Needs cues to weight-bear fully on the left lower extremity when transferring and squatting.  He was renewed today for 8 more weeks of physical therapy if needed.   OBJECTIVE IMPAIRMENTS Abnormal gait, decreased activity tolerance, decreased endurance, decreased mobility, difficulty walking, decreased ROM, decreased strength, hypomobility, increased edema, and pain.    ACTIVITY LIMITATIONS carrying, lifting, bending, standing, squatting, transfers, and dressing   PARTICIPATION LIMITATIONS: driving, community activity, and occupation   PERSONAL FACTORS  None  are also affecting patient's functional outcome.    REHAB POTENTIAL: Good   CLINICAL DECISION MAKING: Stable/uncomplicated   EVALUATION COMPLEXITY: Low     GOALS: Goals reviewed with patient? Yes   SHORT TERM GOALS: Target date: 07/02/2022  Pt will demonstrate appropriate understanding and performance of initially prescribed HEP in order to facilitate improved independence with management of symptoms.  Baseline:  HEP provided on eval 07/25/22: good performance, limited compliance d/t business Goal status: MET   2. Pt will score greater than or equal to 54 on FOTO in order to demonstrate improved perception of function due to symptoms.            Baseline: 49  07/25/22: 73%            Goal status: MET   LONG TERM GOALS: Target date: 07/30/2022    Pt will score 62 or greater on FOTO in order to demonstrate improved perception of function due to symptoms.  Baseline: 49 07/25/22: 73% Goal status: MET   2.  Pt will demonstrate at least 0-120 degrees of L knee AROM in order to facilitate improved tolerance to functional movements such as walking and lifting.  Baseline: -8 ext, 78 flexion 07/25/22: 0 ext, 94 flexion (actively, 100deg passively) Goal status: ONGOING   3.  Pt will be able to lift up to 10# in order to demonstrate improved capacity for daily activities such as lifting/carrying drapery for work activities.  Baseline: NT due to proximity to surgery Goal status: INITIAL   4.  Pt will be able to perform 5xSTS in less than or equal to 11sec with appropriate mechanics in order to demonstrate reduced fall risk and improved functional independence (MCID 5xSTS = 2.3 sec). Baseline: 14 sec, with significant anterior placement of LLE Goal status: INITIAL  PLAN: PT FREQUENCY: 2x/week   PT DURATION: 8 weeks   PLANNED INTERVENTIONS: Therapeutic exercises, Therapeutic activity, Neuromuscular re-education, Balance training, Gait training, Patient/Family education, Self Care, Joint mobilization, Stair training, DME instructions, Dry Needling, Electrical stimulation, Cryotherapy, Moist heat, Vasopneumatic device, Manual therapy, and Re-evaluation   PLAN FOR NEXT SESSION:   progress strength/stability training as able/appropriate, increased emphasis on hamstring and knee flexion. Assess appropriateness for functional lifting    Raeford Razor, PT 08/02/22 6:58 PM Phone: [phone] Fax:  [phone]

## 2022-08-02 ENCOUNTER — Encounter: Payer: Self-pay | Admitting: Physical Therapy

## 2022-08-02 ENCOUNTER — Ambulatory Visit: Payer: Medicare Other | Admitting: Physical Therapy

## 2022-08-02 DIAGNOSIS — M25562 Pain in left knee: Secondary | ICD-10-CM | POA: Diagnosis not present

## 2022-08-02 DIAGNOSIS — M25662 Stiffness of left knee, not elsewhere classified: Secondary | ICD-10-CM

## 2022-08-02 DIAGNOSIS — R2689 Other abnormalities of gait and mobility: Secondary | ICD-10-CM

## 2022-08-02 DIAGNOSIS — R609 Edema, unspecified: Secondary | ICD-10-CM

## 2022-08-02 NOTE — Addendum Note (Signed)
Addended by: Karie Mainland L on: 08/02/2022 07:00 PM   Modules accepted: Orders

## 2022-08-06 NOTE — Therapy (Deleted)
OUTPATIENT PHYSICAL THERAPY TREATMENT NOTE   Patient Name: Levi Strickland MRN: [id number] DOB:1955/12/25, 66 y.o., male Today's Date: 08/06/2022  PCP: none in chart   REFERRING PROVIDER: Gwenlyn Found MD  END OF SESSION:            No past medical history on file. Past Surgical History:  Procedure Laterality Date   JOINT REPLACEMENT     There are no problems to display for this patient.   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  Rationale for Evaluation and Treatment Rehabilitation  PERTINENT HISTORY: Previous MSK surgeries  PRECAUTIONS: s/p TKA 05/22/22  SUBJECTIVE:  Patient reports no pain today he can tell when he sits too long he develops stiffness upon standing.  He had to reschedule last appointment due to work.  He has been doing his exercises. Had a video visit with MD (Mass.)  PAIN:  Are you having pain: stiffness, no pain  0/10 Location: medial L knee How would you describe your pain? Sharp, deep Best in past week: 0/10 Worst in past week: 3-4/10 Aggravating factors: walking >54mn, transfers, lower body dressing, lifting/carrying, prolonged sitting/driving Easing factors: ice, elevated   OBJECTIVE: (objective measures completed at initial evaluation unless otherwise dated)   DIAGNOSTIC FINDINGS:  None in chart     PATIENT SURVEYS:  FOTO 49 FOTO 07/25/22: 73%    COGNITION:           Overall cognitive status: Within functional limits for tasks assessed                          SENSATION: Does not endorse any sensory deficits   EDEMA:  Generalized swelling L knee joint - figure 8 deferred as pt arrives with dressing on incision - appears clean and intact, no concerns.       PALPATION: Palpable tightness L quad/TFL and hip musculature   LOWER EXTREMITY ROM:   Active/Passive ROM Right eval Left eval Left 07/03/22 07/10/22 Left passive only 07/25/22 L  AROM/PROM  Knee flexion WFL 78/85 95/96 98 94/100  Knee extension 0 -8/-5 0/0 mild subjective tightness  WNL   (Blank rows = not tested)   Comments: Performed passive physiological movement flex/extension of L knee with gentle oscillations to reduce muscle guarding, pt reports good relief       LOWER EXTREMITY MMT:     MMT Right eval Left eval Lt 08/02/22  Hip flexion _0 Hip abduction (modified sitting) 5 5 4+  Knee flexion     5  Knee extension     5   (Blank rows = not tested)   Comments: strain with L hip flexion, no pain. Knee MMT deferred given proximity to surgery     FUNCTIONAL TESTS:  5 times sit to stand: 14 sec STS   - standard chair, no UE support. Significant anterior positioning of LLE and weight shift to RLE noted   GAIT: Distance walked: within clinic Assistive device utilized: Single point cane Level of assistance: Modified independence Comments: partial step through pattern, SPC in RUE, fwd flexed posture, reduced stance time and step length on LLE       [facility]  [facility] Therapeutic Exercise: *** Manual Therapy: *** Neuromuscular re-ed: *** Therapeutic Activity: *** Modalities: *** Self Care: ***  [name] Adult PT Treatment:                                                DATE: 08/02/22 Therapeutic Exercise: Recumbent bike 5 min 1/2 revs, level 1  Knee flexion AROM supine , then with strap Bridging on mat x 10, hamstring bridge x 10 heels on bolster Prone stretch with strap x 30 sec x 5  Prone knee flexion 4 lbs cuff weight x 20  Prone hip ext 4 lbs  x 10  Sit to stand x 10 lbs x 15 , x 10 with wgt on LLE  Wall squats with mirror feedback to incr. WB on LLE x 2 x 10   [facility] Adult PT Treatment:                                                DATE: 07/25/22 Therapeutic Exercise: Bike 5 min half revolutions for tissue extensibility Prone knee flex  stretch w/ strap 3x30sec Machine hamstring curl 15# 3x8 LLE only Machine knee ext 10# 2x10 LLE only STS standard chair  2x10, no UE support, cues for fwd trunk lean  Manual Therapy: Knee flexion/ext passive physiological movement  with grade 2-3 joint mobilizations AP/PA, oscillations to reduce muscle guarding Contract/relax hamstring/quad 5x5 LLE only with knee flexion passive physiological movement  between sets  [facility] Adult PT Treatment:                                                DATE: 07/10/22 Therapeutic Exercise: Bike 76mn half revolutions for tissue extensibility Machine hamstring curl 10# x10, 15# 2x8 STS chair + airex 2x10 Supine heel slide with strap x10 10sec hold  LAQ 2x8 4# ankle weight cues for reduced compensations at trunk and hip Super set 4inch step ups x8, 8inch weight shifts + push downs x8, 2 laps    [facility] Adult PT Treatment:                                                DATE: 07/05/2022  Therapeutic Exercise: Bike 5 min half revolutions for tissue extensibility STS from chair + 2 airex, 5# KB 3x5 Machine hamstring curl 10#, 2x15 RLE for support, focus on LLE Heel raises 2x12 w UE support as needed  Therapeutic Activity: STS from chair + airex, 2x8 8inch step fwd lunge, 3x8, emphasis on knee flexion ROM 4inch step up w/o UE support 2x10  PATIENT EDUCATION:  Education details: HEP, rationale for interventions throughout, AROM and progress.  Person educated: Patient Education method: Explanation, Demonstration, Tactile cues, Verbal cues Education comprehension: verbalized understanding, returned demonstration, verbal cues required, tactile cues required, and needs further education      HOME EXERCISE PROGRAM: Access Code: [email or web address]: [email or web address] Date: 06/04/2022 Prepared by: DEnis Slipper  Exercises - Supine Quad Set  -  1 x daily - 7 x weekly - 3 sets - 10 reps - Seated Long Arc Quad  - 1 x daily - 7 x weekly - 3 sets - 10  reps - Seated Knee Flexion Extension AROM (seated heel slide with strap)  - 1 x daily - 7 x weekly - 3 sets - 10 reps  ADDED 08/02/22: PRONE knee flexion, bridging  ASSESSMENT:   CLINICAL IMPRESSION: Patient reported today without major complaints.  As stated last session he has improved his range of motion and objective measures.  He does feel like he would benefit from more physical therapy.  He has 0 degrees extension on left knee which is great.  Left knee actively gets to about 95 degrees.  He continues to exhibit hip extension weakness and mild antalgic gait.  His HEP was updated. Needs cues to weight-bear fully on the left lower extremity when transferring and squatting.  He was renewed today for 8 more weeks of physical therapy if needed.   OBJECTIVE IMPAIRMENTS Abnormal gait, decreased activity tolerance, decreased endurance, decreased mobility, difficulty walking, decreased ROM, decreased strength, hypomobility, increased edema, and pain.    ACTIVITY LIMITATIONS carrying, lifting, bending, standing, squatting, transfers, and dressing   PARTICIPATION LIMITATIONS: driving, community activity, and occupation   PERSONAL FACTORS  None  are also affecting patient's functional outcome.    REHAB POTENTIAL: Good   CLINICAL DECISION MAKING: Stable/uncomplicated   EVALUATION COMPLEXITY: Low     GOALS: Goals reviewed with patient? Yes   SHORT TERM GOALS: Target date: 07/02/2022  Pt will demonstrate appropriate understanding and performance of initially prescribed HEP in order to facilitate improved independence with management of symptoms.  Baseline: HEP provided on eval 07/25/22: good performance, limited compliance d/t business Goal status: MET   2. Pt will score greater than or equal to 54 on FOTO in order to demonstrate improved perception of function due to symptoms.            Baseline: 49  07/25/22: 73%            Goal status: MET   LONG TERM GOALS: Target date: 07/30/2022     Pt will score 62 or greater on FOTO in order to demonstrate improved perception of function due to symptoms.  Baseline: 49 07/25/22: 73% Goal status: MET   2.  Pt will demonstrate at least 0-120 degrees of L knee AROM in order to facilitate improved tolerance to functional movements such as walking and lifting.  Baseline: -8 ext, 78 flexion 07/25/22: 0 ext, 94 flexion (actively, 100deg passively) Goal status: ONGOING   3.  Pt will be able to lift up to 10# in order to demonstrate improved capacity for daily activities such as lifting/carrying drapery for work activities.  Baseline: NT due to proximity to surgery Goal status: INITIAL   4.  Pt will be able to perform 5xSTS in less than or equal to 11sec with appropriate mechanics in order to demonstrate reduced fall risk and improved functional independence (MCID 5xSTS = 2.3 sec). Baseline: 14 sec, with significant anterior placement of LLE Goal status: INITIAL     PLAN: PT FREQUENCY: 2x/week   PT DURATION: 8 weeks   PLANNED INTERVENTIONS: Therapeutic exercises, Therapeutic activity, Neuromuscular re-education, Balance training, Gait training, Patient/Family education, Self Care, Joint mobilization, Stair training, DME instructions, Dry Needling, Electrical stimulation, Cryotherapy, Moist heat, Vasopneumatic device, Manual therapy, and Re-evaluation   PLAN FOR NEXT SESSION:   progress strength/stability training as able/appropriate, increased emphasis on  hamstring and knee flexion. Assess appropriateness for functional lifting    Raeford Razor, PT 08/06/22 1:13 PM Phone: [phone] Fax: [phone]

## 2022-08-07 ENCOUNTER — Encounter: Payer: Medicare Other | Admitting: Physical Therapy

## 2022-08-08 ENCOUNTER — Ambulatory Visit: Payer: Medicare Other | Admitting: Physical Therapy

## 2022-08-08 ENCOUNTER — Encounter: Payer: Self-pay | Admitting: Physical Therapy

## 2022-08-08 DIAGNOSIS — R609 Edema, unspecified: Secondary | ICD-10-CM

## 2022-08-08 DIAGNOSIS — M25662 Stiffness of left knee, not elsewhere classified: Secondary | ICD-10-CM

## 2022-08-08 DIAGNOSIS — M25562 Pain in left knee: Secondary | ICD-10-CM

## 2022-08-08 DIAGNOSIS — R2689 Other abnormalities of gait and mobility: Secondary | ICD-10-CM

## 2022-08-08 NOTE — Therapy (Signed)
OUTPATIENT PHYSICAL THERAPY TREATMENT NOTE   Patient Name: Levi Strickland MRN: [id number] DOB:April 22, 1956, 66 y.o., male Today's Date: 08/08/2022  PCP: none in chart   REFERRING PROVIDER: Gwenlyn Found MD  END OF SESSION:   PT End of Session - 08/08/22 1000     Visit Number 10    Number of Visits 17    Date for PT Re-Evaluation 09/27/22    Authorization Type Medicare AB    PT Start Time 0935    PT Stop Time 1020    PT Time Calculation (min) 45 min    Activity Tolerance Patient tolerated treatment well;No increased pain    Behavior During Therapy [facility] for tasks assessed/performed                     History reviewed. No pertinent past medical history. Past Surgical History:  Procedure Laterality Date   JOINT REPLACEMENT     There are no problems to display for this patient.   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  No pain this AM it only hurts when I bend it too much.   PAIN:  Are you having pain: stiffness, no pain  0/10 Location: medial L knee How would you describe your pain? Sharp, deep Best in past week: 0/10 Worst in past week: 3-4/10 Aggravating factors: walking >83mn, transfers, lower body dressing, lifting/carrying, prolonged sitting/driving Easing factors: ice, elevated   OBJECTIVE: (objective measures completed at initial evaluation unless otherwise dated)   DIAGNOSTIC FINDINGS:  None in chart     PATIENT SURVEYS:  FOTO 49 FOTO 07/25/22: 73%    COGNITION:           Overall cognitive status: Within functional limits for tasks assessed                           SENSATION: Does not endorse any sensory deficits   EDEMA:  Generalized swelling L knee joint - figure 8 deferred as pt arrives with dressing on incision - appears clean and intact, no concerns.       PALPATION: Palpable tightness L quad/TFL and hip musculature   LOWER EXTREMITY ROM:   Active/Passive ROM Right eval Left eval Left 07/03/22 07/10/22 Left passive only 07/25/22 L AROM/PROM  Knee flexion WFL 78/85 95/96 98 94/100  Knee extension 0 -8/-5 0/0 mild subjective tightness  WNL   (Blank rows = not tested)   Comments: Performed passive physiological movement flex/extension of L knee with gentle oscillations to reduce muscle guarding, pt reports good relief       LOWER EXTREMITY MMT:     MMT Right eval Left eval Lt 08/02/22  Hip flexion _0 Hip abduction (modified sitting) 5 5 4+  Knee flexion     5  Knee extension     5   (Blank rows = not tested)   Comments: strain with L hip flexion, no pain. Knee MMT deferred given proximity to surgery     FUNCTIONAL TESTS:  5 times sit to stand: 14 sec STS   - standard chair, no UE support. Significant anterior positioning  of LLE and weight shift to RLE noted   GAIT: Distance walked: within clinic Assistive device utilized: Single point cane Level of assistance: Modified independence Comments: partial step through pattern, SPC in RUE, fwd flexed posture, reduced stance time and step length on LLE       TODAY'S TREATMENT:  [facility] Adult PT Treatment:                                                DATE: 08/08/22 Therapeutic Exercise: NuStep 5 min L5 UE A Recumbent bike 4 min revolutions only  Sit to stand with 10 lbs x 15  Staggered squats LLE with 10 lbs on LLE x 10  Step downs with hand support  x 15  Lateral step ups LLE x 15  Hip abduction off step x 15 each Leg press 2 plates 2 x 15  then single  Bridge x 15 bilateral  then LLE only x 10  Manual Therapy: Seated with belt flexion mobs, Gr  III Contract-relax quads x 3  PROM L knee flexion seated    [facility] Adult PT Treatment:                                                DATE: 08/02/22 Therapeutic Exercise: Recumbent bike 5 min 1/2 revs, level 1  Knee flexion AROM supine , then with strap Bridging on mat x 10, hamstring bridge x 10 heels on bolster Prone stretch with strap x 30 sec x 5  Prone knee flexion 4 lbs cuff weight x 20  Prone hip ext 4 lbs  x 10  Sit to stand x 10 lbs x 15 , x 10 with wgt on LLE  Wall squats with mirror feedback to incr. WB on LLE x 2 x 10    PATIENT EDUCATION:  Education details: HEP, rationale for interventions throughout, AROM and progress.  Person educated: Patient Education method: Explanation, Demonstration, Tactile cues, Verbal cues Education comprehension: verbalized understanding, returned demonstration, verbal cues required, tactile cues required, and needs further education      HOME EXERCISE PROGRAM: Access Code: [email or web address] URL: [email or web address] Date: 06/04/2022 Prepared by: Enis Slipper   Exercises - Supine Quad Set  - 1 x daily - 7 x weekly - 3 sets - 10 reps - Seated Long Arc Quad  - 1 x daily - 7 x weekly - 3 sets - 10 reps - Seated Knee Flexion Extension AROM (seated heel slide with strap)  - 1 x daily - 7 x weekly - 3 sets - 10 reps  ADDED 08/02/22: PRONE knee flexion, bridging  ASSESSMENT:   CLINICAL IMPRESSION: Patient continues to show LLE stiffness into knee flexion.  He tolerated manual therapy well. He has decent knee control with closed chain exercises. He lacks knee flexion to allow for normal sit to stand transfers and highly favors his RT side with functional mobility.  Will miss next week due to travelling, holidays.    OBJECTIVE IMPAIRMENTS Abnormal gait, decreased activity tolerance, decreased endurance, decreased mobility, difficulty walking, decreased ROM, decreased strength, hypomobility, increased edema, and pain.    ACTIVITY  LIMITATIONS carrying, lifting, bending, standing, squatting, transfers, and dressing   PARTICIPATION LIMITATIONS: driving, community activity, and occupation  PERSONAL FACTORS  None  are also affecting patient's functional outcome.    REHAB POTENTIAL: Good   CLINICAL DECISION MAKING: Stable/uncomplicated   EVALUATION COMPLEXITY: Low     GOALS: Goals reviewed with patient? Yes   SHORT TERM GOALS: Target date: 07/02/2022  Pt will demonstrate appropriate understanding and performance of initially prescribed HEP in order to facilitate improved independence with management of symptoms.  Baseline: HEP provided on eval 07/25/22: good performance, limited compliance d/t business Goal status: MET   2. Pt will score greater than or equal to 54 on FOTO in order to demonstrate improved perception of function due to symptoms.            Baseline: 49  07/25/22: 73%            Goal status: MET   LONG TERM GOALS: Target date: 07/30/2022    Pt will score 62 or greater on FOTO in order to demonstrate improved perception of function due to symptoms.  Baseline: 49 07/25/22: 73% Goal status: MET   2.  Pt will demonstrate at least 0-120 degrees of L knee AROM in order to facilitate improved tolerance to functional movements such as walking and lifting.  Baseline: -8 ext, 78 flexion 07/25/22: 0 ext, 94 flexion (actively, 100deg passively) Goal status: ONGOING   3.  Pt will be able to lift up to 10# in order to demonstrate improved capacity for daily activities such as lifting/carrying drapery for work activities.  Baseline: NT due to proximity to surgery Goal status: INITIAL   4.  Pt will be able to perform 5xSTS in less than or equal to 11sec with appropriate mechanics in order to demonstrate reduced fall risk and improved functional independence (MCID 5xSTS = 2.3 sec). Baseline: 14 sec, with significant anterior placement of LLE Goal status: INITIAL     PLAN: PT FREQUENCY: 2x/week   PT  DURATION: 8 weeks   PLANNED INTERVENTIONS: Therapeutic exercises, Therapeutic activity, Neuromuscular re-education, Balance training, Gait training, Patient/Family education, Self Care, Joint mobilization, Stair training, DME instructions, Dry Needling, Electrical stimulation, Cryotherapy, Moist heat, Vasopneumatic device, Manual therapy, and Re-evaluation   PLAN FOR NEXT SESSION:   progress strength/stability training as able/appropriate, increased emphasis on hamstring and knee flexion. Assess appropriateness for functional lifting    Raeford Razor, PT 08/08/22 10:03 AM Phone: [phone] Fax: [phone]

## 2022-08-09 ENCOUNTER — Encounter: Payer: Medicare Other | Admitting: Physical Therapy

## 2022-08-20 NOTE — Therapy (Unsigned)
OUTPATIENT PHYSICAL THERAPY TREATMENT NOTE   Patient Name: Levi Strickland MRN: [id number] DOB:[birth year], 66 y.o., male Today's Date: 08/21/2022  PCP: none in chart   REFERRING PROVIDER: Gwenlyn Found MD  END OF SESSION:   PT End of Session - 08/21/22 1552     Visit Number 11    Number of Visits 17    Date for PT Re-Evaluation 09/27/22    Authorization Type Medicare AB    PT Start Time 5056    PT Stop Time 1630    PT Time Calculation (min) 38 min    Activity Tolerance Patient tolerated treatment well;No increased pain    Behavior During Therapy [facility] for tasks assessed/performed                      History reviewed. No pertinent past medical history. Past Surgical History:  Procedure Laterality Date   JOINT REPLACEMENT     There are no problems to display for this patient.   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  Been travelling, continuing to stretch.  Min stiffness in AM.    PAIN:  Are you having pain: stiffness, no pain  0/10 Location: medial L knee How would you describe your pain? Sharp, deep Best in past week: 0/10 Worst in past week: 3-4/10 Aggravating factors: walking >63mn, transfers, lower body dressing, lifting/carrying, prolonged sitting/driving Easing factors: ice, elevated   OBJECTIVE: (objective measures completed at initial evaluation unless otherwise dated)   DIAGNOSTIC FINDINGS:  None in chart     PATIENT SURVEYS:  FOTO 49 FOTO 07/25/22: 73%    COGNITION:           Overall cognitive status: Within functional limits for tasks assessed                           SENSATION: Does not endorse any sensory deficits   EDEMA:  Generalized swelling L knee joint - figure 8 deferred as pt arrives with dressing on incision - appears clean and intact, no concerns.       PALPATION: Palpable tightness L quad/TFL and hip musculature   LOWER EXTREMITY ROM:   Active/Passive ROM Right eval Left eval Left 07/03/22 07/10/22 Left passive only 07/25/22 L AROM/PROM 08/21/22  PROM   Knee flexion WFL 78/85 95/96 98 94/100 105 best   Knee extension 0 -8/-5 0/0 mild subjective tightness  WNL    (Blank rows = not tested)   Comments: Performed passive physiological movement flex/extension of L knee with gentle oscillations to reduce muscle guarding, pt reports good relief       LOWER EXTREMITY MMT:     MMT Right eval Left eval Lt 08/02/22  Hip flexion _0 Hip abduction (modified sitting) 5 5 4+  Knee flexion     5  Knee extension     5   (Blank rows = not tested)   Comments: strain with L hip flexion, no pain. Knee MMT deferred given proximity to surgery     FUNCTIONAL TESTS:  5 times sit to stand: 14 sec STS   - standard chair, no UE  support. Significant anterior positioning of LLE and weight shift to RLE noted   GAIT: Distance walked: within clinic Assistive device utilized: Single point cane Level of assistance: Modified independence Comments: partial step through pattern, SPC in RUE, fwd flexed posture, reduced stance time and step length on LLE       TODAY'S TREATMENT:   [facility] Adult PT Treatment:                                                DATE: 08/21/22 Therapeutic Exercise: Recumbent bike , no tension initially, moved seat forward then continued for about 6 min for ROM  Hamstring curl 25 lbs focus on LLE slow  x 20  Quad ext 25 lbs x 20  Deep squat supported by bar x 15 , then x 10 without UE support  8 inch step downs (reverse)  with and without UE support  Prone knee flexion with strap x 30 sec x 3 Supine knee  flexion x 5 reps  Hip flexor stretch with strap 60 sec   Manual Therapy: PROM L knee flexion in prone, supine hip flexed and then supine hip ext off table.    [facility] Adult PT Treatment:                                                DATE: 08/08/22 Therapeutic Exercise: NuStep 5 min L5 UE A Recumbent bike 4 min revolutions only  Sit to stand with 10 lbs x 15  Staggered squats LLE with 10 lbs on LLE x 10  Step downs with hand support  x 15  Lateral step ups LLE x 15  Hip abduction off step x 15 each Leg press 2 plates 2 x 15  then single  Bridge x 15 bilateral  then LLE only x 10  Manual Therapy: Seated with belt flexion mobs, Gr III Contract-relax quads x 3  PROM L knee flexion seated    [facility] Adult PT Treatment:                                                DATE: 08/02/22 Therapeutic Exercise: Recumbent bike 5 min 1/2 revs, level 1  Knee flexion AROM supine , then with strap Bridging on mat x 10, hamstring bridge x 10 heels on bolster Prone stretch with strap x 30 sec x 5  Prone knee flexion 4 lbs cuff weight x 20  Prone hip ext 4 lbs  x 10  Sit to stand x 10 lbs x 15 , x 10 with wgt on LLE  Wall squats with mirror feedback to incr. WB on LLE x 2 x 10    PATIENT EDUCATION:  Education details: HEP, rationale for interventions throughout, AROM and progress.  Person educated: Patient Education method: Explanation, Demonstration, Tactile cues, Verbal cues Education comprehension: verbalized understanding, returned demonstration, verbal cues required, tactile cues required, and needs further education      HOME EXERCISE PROGRAM: Access Code: [email or web address] URL: [email or web address] Date: 06/04/2022 Prepared by: Enis Slipper   Exercises - Supine Quad Set  - 1 x daily -  7 x weekly - 3 sets - 10 reps - Seated Long Arc Quad  - 1 x daily - 7 x weekly - 3 sets - 10 reps - Seated Knee Flexion Extension AROM (seated heel slide with strap)  - 1 x daily - 7 x weekly - 3  sets - 10 reps    ADDED 08/02/22: PRONE knee flexion, bridging  ASSESSMENT:   CLINICAL IMPRESSION: Patient continues to be limited in L knee flexion ROM but strength, control and function are all improving.  Able to get to 105 passively today.  Cont to progress as tolerated with closed chain strengthening and knee control.    OBJECTIVE IMPAIRMENTS Abnormal gait, decreased activity tolerance, decreased endurance, decreased mobility, difficulty walking, decreased ROM, decreased strength, hypomobility, increased edema, and pain.    ACTIVITY LIMITATIONS carrying, lifting, bending, standing, squatting, transfers, and dressing   PARTICIPATION LIMITATIONS: driving, community activity, and occupation   PERSONAL FACTORS  None  are also affecting patient's functional outcome.    REHAB POTENTIAL: Good   CLINICAL DECISION MAKING: Stable/uncomplicated   EVALUATION COMPLEXITY: Low     GOALS: Goals reviewed with patient? Yes   SHORT TERM GOALS: Target date: 07/02/2022  Pt will demonstrate appropriate understanding and performance of initially prescribed HEP in order to facilitate improved independence with management of symptoms.  Baseline: HEP provided on eval 07/25/22: good performance, limited compliance d/t business Goal status: MET   2. Pt will score greater than or equal to 54 on FOTO in order to demonstrate improved perception of function due to symptoms.            Baseline: 49  07/25/22: 73%            Goal status: MET   LONG TERM GOALS: Target date: 07/30/2022    Pt will score 62 or greater on FOTO in order to demonstrate improved perception of function due to symptoms.  Baseline: 49 07/25/22: 73% Goal status: MET   2.  Pt will demonstrate at least 0-120 degrees of L knee AROM in order to facilitate improved tolerance to functional movements such as walking and lifting.  Baseline: -8 ext, 78 flexion 07/25/22: 0 ext, 94 flexion (actively, 100deg passively) Goal status: ONGOING    3.  Pt will be able to lift up to 10# in order to demonstrate improved capacity for daily activities such as lifting/carrying drapery for work activities.  Baseline: NT due to proximity to surgery Goal status: INITIAL   4.  Pt will be able to perform 5xSTS in less than or equal to 11sec with appropriate mechanics in order to demonstrate reduced fall risk and improved functional independence (MCID 5xSTS = 2.3 sec). Baseline: 14 sec, with significant anterior placement of LLE Goal status: INITIAL     PLAN: PT FREQUENCY: 2x/week   PT DURATION: 8 weeks   PLANNED INTERVENTIONS: Therapeutic exercises, Therapeutic activity, Neuromuscular re-education, Balance training, Gait training, Patient/Family education, Self Care, Joint mobilization, Stair training, DME instructions, Dry Needling, Electrical stimulation, Cryotherapy, Moist heat, Vasopneumatic device, Manual therapy, and Re-evaluation   PLAN FOR NEXT SESSION:   Begin lifting, leg press/wall sit and manual therapy    Raeford Razor, PT 08/21/22 4:18 PM Phone: [phone] Fax: [phone]

## 2022-08-21 ENCOUNTER — Encounter: Payer: Self-pay | Admitting: Physical Therapy

## 2022-08-21 ENCOUNTER — Ambulatory Visit: Payer: Medicare Other | Admitting: Physical Therapy

## 2022-08-21 DIAGNOSIS — M25662 Stiffness of left knee, not elsewhere classified: Secondary | ICD-10-CM

## 2022-08-21 DIAGNOSIS — M25562 Pain in left knee: Secondary | ICD-10-CM

## 2022-08-21 DIAGNOSIS — R2689 Other abnormalities of gait and mobility: Secondary | ICD-10-CM

## 2022-08-21 DIAGNOSIS — R609 Edema, unspecified: Secondary | ICD-10-CM

## 2022-08-23 ENCOUNTER — Ambulatory Visit: Payer: Medicare Other | Attending: Orthopaedic Surgery | Admitting: Physical Therapy

## 2022-08-23 ENCOUNTER — Encounter: Payer: Self-pay | Admitting: Physical Therapy

## 2022-08-23 DIAGNOSIS — M25662 Stiffness of left knee, not elsewhere classified: Secondary | ICD-10-CM | POA: Diagnosis present

## 2022-08-23 DIAGNOSIS — R2689 Other abnormalities of gait and mobility: Secondary | ICD-10-CM | POA: Diagnosis present

## 2022-08-23 DIAGNOSIS — R609 Edema, unspecified: Secondary | ICD-10-CM | POA: Insufficient documentation

## 2022-08-23 DIAGNOSIS — Z96652 Presence of left artificial knee joint: Secondary | ICD-10-CM | POA: Insufficient documentation

## 2022-08-23 DIAGNOSIS — M25562 Pain in left knee: Secondary | ICD-10-CM | POA: Diagnosis present

## 2022-08-23 DIAGNOSIS — Z471 Aftercare following joint replacement surgery: Secondary | ICD-10-CM | POA: Diagnosis not present

## 2022-08-23 NOTE — Therapy (Signed)
OUTPATIENT PHYSICAL THERAPY TREATMENT NOTE   Patient Name: Levi Strickland MRN: [id number] DOB:[birth year], 66 y.o., male Today's Date: 08/23/2022  PCP: none in chart   REFERRING PROVIDER: Gwenlyn Found MD  END OF SESSION:   PT End of Session - 08/23/22 1020     Visit Number 12    Number of Visits 17    Date for PT Re-Evaluation 09/27/22    Authorization Type Medicare AB    Progress Note Due on Visit 10    PT Start Time 1017    PT Stop Time 1100    PT Time Calculation (min) 43 min    Activity Tolerance Patient tolerated treatment well;No increased pain    Behavior During Therapy [facility] for tasks assessed/performed                       History reviewed. No pertinent past medical history. Past Surgical History:  Procedure Laterality Date   JOINT REPLACEMENT     There are no problems to display for this patient.   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 No pain, just a little stiff   PAIN:  Are you having pain: stiffness, no pain  0/10 Location: medial L knee How would you describe your pain? Sharp, deep Best in past week: 0/10 Worst in past week: 3-4/10 Aggravating factors: walking >34mn, transfers, lower body dressing, lifting/carrying, prolonged sitting/driving Easing factors: ice, elevated   OBJECTIVE: (objective measures completed at initial evaluation unless otherwise dated)   DIAGNOSTIC FINDINGS:  None in chart     PATIENT SURVEYS:  FOTO 49 FOTO 07/25/22: 73%    COGNITION:           Overall cognitive status: Within functional  limits for tasks assessed                          SENSATION: Does not endorse any sensory deficits   EDEMA:  Generalized swelling L knee joint - figure 8 deferred as pt arrives with dressing on incision - appears clean and intact, no concerns.       PALPATION: Palpable tightness L quad/TFL and hip musculature   LOWER EXTREMITY ROM:   Active/Passive ROM Right eval Left eval Left 07/03/22 07/10/22 Left passive only 07/25/22 L AROM/PROM 08/21/22  PROM   Knee flexion WFL 78/85 95/96 98 94/100 105 best   Knee extension 0 -8/-5 0/0 mild subjective tightness  WNL    (Blank rows = not tested)   Comments: Performed passive physiological movement flex/extension of L knee with gentle oscillations to reduce muscle guarding, pt reports good relief       LOWER EXTREMITY MMT:     MMT Right eval Left eval Lt 08/02/22  Hip flexion _0 Hip abduction (modified sitting) 5 5 4+  Knee flexion     5  Knee extension     5   (Blank rows = not tested)   Comments: strain with L hip flexion, no pain. Knee MMT deferred given proximity to surgery     FUNCTIONAL TESTS:  5 times sit to  stand: 14 sec STS   - standard chair, no UE support. Significant anterior positioning of LLE and weight shift to RLE noted   GAIT: Distance walked: within clinic Assistive device utilized: Single point cane Level of assistance: Modified independence Comments: partial step through pattern, SPC in RUE, fwd flexed posture, reduced stance time and step length on LLE       TODAY'S TREATMENT:  [facility] Adult PT Treatment:                                                DATE: 08/23/22 Therapeutic Exercise: Recumbant bike no tension for full revolution x 6 min  Wall squat/sitting with ball behind back  x 15 Wall sit 30 sec on ball x 3  Reverse glider lunge holding wall for hip strength  x 10 each  Piriformis seated  Hamstring curl bilateral x 15 Bridging with ball 2 x 15 (hamstring/glute)  Bridge add knee flexion  x 5   Knee AAROM flexion and extension  Manual Therapy: PROM L knee flexion , Gr I-III mobilization knee flexion  IASTM L thigh with LLE off table   [facility] Adult PT Treatment:                                                DATE: 08/21/22 Therapeutic Exercise: Recumbent bike , no tension initially, moved seat forward then continued for about 6 min for ROM  Hamstring curl 25 lbs focus on LLE slow  x 20  Quad ext 25 lbs x 20  Deep squat supported by bar x 15 , then x 10 without UE support  8 inch step downs (reverse)  with and without UE support  Prone knee flexion with strap x 30 sec x 3 Supine knee flexion x 5 reps  Hip flexor stretch with strap 60 sec   Manual Therapy: PROM L knee flexion in prone, supine hip flexed and then supine hip ext off table.    [facility] Adult PT Treatment:                                                DATE: 08/08/22 Therapeutic Exercise: NuStep 5 min L5 UE A Recumbent bike 4 min revolutions only  Sit to stand with 10 lbs x 15  Staggered squats LLE with 10 lbs on LLE x 10  Step downs with hand support  x 15  Lateral step ups LLE x 15  Hip abduction off step x 15 each Leg press 2 plates 2 x 15  then single  Bridge x 15 bilateral  then LLE only x 10  Manual Therapy: Seated with belt flexion mobs, Gr III Contract-relax quads x 3  PROM L knee flexion seated    [facility] Adult PT Treatment:                                                DATE: 08/02/22 Therapeutic Exercise: Recumbent bike 5 min 1/2  revs, level 1  Knee flexion AROM supine , then with strap Bridging on mat x 10, hamstring bridge x 10 heels on bolster Prone stretch with strap x 30 sec x 5  Prone knee flexion 4 lbs cuff weight x 20  Prone hip ext 4 lbs  x 10  Sit to stand x 10 lbs x 15 , x 10 with wgt on LLE  Wall squats with mirror feedback to incr. WB on LLE x 2 x 10    PATIENT EDUCATION:  Education details: HEP, rationale for interventions throughout, AROM and progress.  Person educated:  Patient Education method: Explanation, Demonstration, Tactile cues, Verbal cues Education comprehension: verbalized understanding, returned demonstration, verbal cues required, tactile cues required, and needs further education      HOME EXERCISE PROGRAM: Access Code: [email or web address] URL: [email or web address] Date: 06/04/2022 Prepared by: Enis Slipper   Exercises - Supine Quad Set  - 1 x daily - 7 x weekly - 3 sets - 10 reps - Seated Long Arc Quad  - 1 x daily - 7 x weekly - 3 sets - 10 reps - Seated Knee Flexion Extension AROM (seated heel slide with strap)  - 1 x daily - 7 x weekly - 3 sets - 10 reps    ADDED 08/02/22: PRONE knee flexion, bridging  ASSESSMENT:   CLINICAL IMPRESSION: Patient was able to participate in functional strengthening today with a focus on knee flexion without increased pain.  He has expressed that he should get into some sort of exercise program and so we discussed just a general walking program at the place to start.  He has tightness in his hips would like to build some muscle knee flexion range of motion continues to be limited to about 105 degrees.  He is about 3 months post surgical he understands his knee may not be back to 100% until 9 to 12 months after surgery.  He reports feeling much better after session we will continue plan of care as outlined.    OBJECTIVE IMPAIRMENTS Abnormal gait, decreased activity tolerance, decreased endurance, decreased mobility, difficulty walking, decreased ROM, decreased strength, hypomobility, increased edema, and pain.    ACTIVITY LIMITATIONS carrying, lifting, bending, standing, squatting, transfers, and dressing   PARTICIPATION LIMITATIONS: driving, community activity, and occupation   PERSONAL FACTORS  None  are also affecting patient's functional outcome.    REHAB POTENTIAL: Good   CLINICAL DECISION MAKING: Stable/uncomplicated   EVALUATION COMPLEXITY: Low     GOALS: Goals reviewed with patient?  Yes   SHORT TERM GOALS: Target date: 07/02/2022  Pt will demonstrate appropriate understanding and performance of initially prescribed HEP in order to facilitate improved independence with management of symptoms.  Baseline: HEP provided on eval 07/25/22: good performance, limited compliance d/t business Goal status: MET   2. Pt will score greater than or equal to 54 on FOTO in order to demonstrate improved perception of function due to symptoms.            Baseline: 49  07/25/22: 73%            Goal status: MET   LONG TERM GOALS: Target date: 07/30/2022    Pt will score 62 or greater on FOTO in order to demonstrate improved perception of function due to symptoms.  Baseline: 49 07/25/22: 73% Goal status: MET   2.  Pt will demonstrate at least 0-120 degrees of L knee AROM in order to facilitate improved tolerance to functional movements such as walking and lifting.  Baseline: -8 ext, 78 flexion 07/25/22: 0 ext, 94 flexion (actively, 100deg passively) Goal status: ONGOING   3.  Pt will be able to lift up to 10# in order to demonstrate improved capacity for daily activities such as lifting/carrying drapery for work activities.  Baseline: NT due to proximity to surgery Goal status: INITIAL   4.  Pt will be able to perform 5xSTS in less than or equal to 11sec with appropriate mechanics in order to demonstrate reduced fall risk and improved functional independence (MCID 5xSTS = 2.3 sec). Baseline: 14 sec, with significant anterior placement of LLE Goal status: INITIAL     PLAN: PT FREQUENCY: 2x/week   PT DURATION: 8 weeks   PLANNED INTERVENTIONS: Therapeutic exercises, Therapeutic activity, Neuromuscular re-education, Balance training, Gait training, Patient/Family education, Self Care, Joint mobilization, Stair training, DME instructions, Dry Needling, Electrical stimulation, Cryotherapy, Moist heat, Vasopneumatic device, Manual therapy, and Re-evaluation   PLAN FOR NEXT SESSION:    Check goals continue lifting/strength.  leg press/wall sit and manual therapy   Raeford Razor, PT 08/23/22 10:21 AM Phone: [phone] Fax: [phone]

## 2022-08-28 ENCOUNTER — Encounter: Payer: Medicare Other | Admitting: Physical Therapy

## 2022-08-29 ENCOUNTER — Encounter: Payer: Self-pay | Admitting: Physical Therapy

## 2022-08-29 ENCOUNTER — Ambulatory Visit: Payer: Medicare Other | Admitting: Physical Therapy

## 2022-08-29 DIAGNOSIS — M25562 Pain in left knee: Secondary | ICD-10-CM | POA: Diagnosis not present

## 2022-08-29 DIAGNOSIS — R609 Edema, unspecified: Secondary | ICD-10-CM

## 2022-08-29 DIAGNOSIS — M25662 Stiffness of left knee, not elsewhere classified: Secondary | ICD-10-CM

## 2022-08-29 DIAGNOSIS — R2689 Other abnormalities of gait and mobility: Secondary | ICD-10-CM

## 2022-08-29 NOTE — Therapy (Signed)
OUTPATIENT PHYSICAL THERAPY TREATMENT NOTE   Patient Name: Levi Strickland MRN: [id number] DOB:[birth year], 66 y.o., male Today's Date: 08/29/2022  PCP: none in chart   REFERRING PROVIDER: Gwenlyn Found MD  END OF SESSION:   PT End of Session - 08/29/22 0801     Visit Number 13    Number of Visits 17    Date for PT Re-Evaluation 09/27/22    Authorization Type Medicare AB    Progress Note Due on Visit 10    PT Start Time 0800    PT Stop Time 0845    PT Time Calculation (min) 45 min    Activity Tolerance Patient tolerated treatment well;No increased pain    Behavior During Therapy [facility] for tasks assessed/performed                        History reviewed. No pertinent past medical history. Past Surgical History:  Procedure Laterality Date   JOINT REPLACEMENT     There are no problems to display for this patient.   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 Not sure how its feeling yet this AM  PAIN:  Are you having pain: stiffness, no pain  0/10 Location: medial L knee How would you describe your pain? Sharp, deep Best in past week: 0/10 Worst in past week: 3-4/10 Aggravating factors: walking >93mn, transfers, lower body dressing, lifting/carrying, prolonged sitting/driving Easing factors: ice, elevated   OBJECTIVE: (objective measures completed at initial evaluation unless otherwise dated)   DIAGNOSTIC FINDINGS:  None in chart     PATIENT SURVEYS:  FOTO 49 FOTO 07/25/22: 73%    COGNITION:           Overall  cognitive status: Within functional limits for tasks assessed                          SENSATION: Does not endorse any sensory deficits   EDEMA:  Generalized swelling L knee joint - figure 8 deferred as pt arrives with dressing on incision - appears clean and intact, no concerns.       PALPATION: Palpable tightness L quad/TFL and hip musculature   LOWER EXTREMITY ROM:   Active/Passive ROM Right eval Left eval Left 07/03/22 07/10/22 Left passive only 07/25/22 L AROM/PROM 08/21/22  PROM   Knee flexion (Addended 105 deg)  78/85 95/96 98 94/100 105 best   Knee extension 0 -8/-5 0/0 mild subjective tightness  WNL    (Blank rows = not tested)   Comments: Performed passive physiological movement flex/extension of L knee with gentle oscillations to reduce muscle guarding, pt reports good relief       LOWER EXTREMITY MMT:     MMT Right eval Left eval Lt 08/02/22  Hip flexion _0 Hip abduction (modified sitting) 5 5 4+  Knee flexion     5  Knee extension     5   (Blank rows = not tested)   Comments: strain with L hip flexion, no pain. Knee MMT deferred given proximity  to surgery     FUNCTIONAL TESTS:  5 times sit to stand: 14 sec STS   - standard chair, no UE support. Significant anterior positioning of LLE and weight shift to RLE noted   GAIT: Distance walked: within clinic Assistive device utilized: Single point cane Level of assistance: Modified independence Comments: partial step through pattern, SPC in RUE, fwd flexed posture, reduced stance time and step length on LLE       TODAY'S TREATMENT:   [facility] Adult PT Treatment:                                                DATE: 08/29/22 Therapeutic Exercise: Recumbent bike 5 min for full revolutions Seated AAROM L knee  Hamstring curl with green band  x 15 LAQ  5 lbs x 20   Sit to stand 15 lbs x 15 Staggered sit to stand 15 lb on LLE x 15  Lunge 15 lbs x 10  Step up with Rt knee march 15 lbs x 15 (6 inch)   Lateral step up to hip abduction (6 inch)  Airex standing: tandem with head movements in flexion, extension , cervical rotation and eyes closed, done with each leg forward x 30 sec  Airex marching x 15  Lunge with L foot on Airex added small head turns and eyes closed  Leg press 2 plates double leg then LLE x 15 each  Leg press calf stretch then heel raise x 15  Manual Therapy: Seated PROM L knee flexion edge of elevated mat, GR II-III flexion mobs    [facility] Adult PT Treatment:                                                DATE: 08/23/22 Therapeutic Exercise: Recumbant bike no tension for full revolution x 6 min  Wall squat/sitting with ball behind back  x 15 Wall sit 30 sec on ball x 3  Reverse glider lunge holding wall for hip strength  x 10 each  Piriformis seated  Hamstring curl bilateral x 15 Bridging with ball 2 x 15 (hamstring/glute)  Bridge add knee flexion x 5   Knee AAROM flexion and extension  Manual Therapy: PROM L knee flexion , Gr I-III mobilization knee flexion  IASTM L thigh with LLE off table   [facility] Adult PT Treatment:                                                DATE: 08/21/22 Therapeutic Exercise: Recumbent bike , no tension initially, moved seat forward then continued for about 6 min for ROM  Hamstring curl 25 lbs focus on LLE slow  x 20  Quad ext 25 lbs x 20  Deep squat supported by bar x 15 , then x 10 without UE support  8 inch step downs (reverse)  with and without UE support  Prone knee flexion with strap x 30 sec x 3 Supine knee flexion x 5 reps  Hip flexor stretch with strap 60 sec   Manual Therapy: PROM L knee flexion in prone, supine hip  flexed and then supine hip ext off table.    [facility] Adult PT Treatment:                                                DATE: 08/08/22 Therapeutic Exercise: NuStep 5 min L5 UE A Recumbent bike 4 min revolutions only  Sit to stand with 10 lbs x 15  Staggered squats LLE with 10 lbs on LLE x 10  Step downs with  hand support  x 15  Lateral step ups LLE x 15  Hip abduction off step x 15 each Leg press 2 plates 2 x 15  then single  Bridge x 15 bilateral  then LLE only x 10  Manual Therapy: Seated with belt flexion mobs, Gr III Contract-relax quads x 3  PROM L knee flexion seated    [facility] Adult PT Treatment:                                                DATE: 08/02/22 Therapeutic Exercise: Recumbent bike 5 min 1/2 revs, level 1  Knee flexion AROM supine , then with strap Bridging on mat x 10, hamstring bridge x 10 heels on bolster Prone stretch with strap x 30 sec x 5  Prone knee flexion 4 lbs cuff weight x 20  Prone hip ext 4 lbs  x 10  Sit to stand x 10 lbs x 15 , x 10 with wgt on LLE  Wall squats with mirror feedback to incr. WB on LLE x 2 x 10    PATIENT EDUCATION:  Education details: HEP, rationale for interventions throughout, AROM and progress.  Person educated: Patient Education method: Explanation, Demonstration, Tactile cues, Verbal cues Education comprehension: verbalized understanding, returned demonstration, verbal cues required, tactile cues required, and needs further education      HOME EXERCISE PROGRAM: Access Code: [email or web address] URL: [email or web address] Date: 06/04/2022 Prepared by: Enis Slipper   Exercises - Supine Quad Set  - 1 x daily - 7 x weekly - 3 sets - 10 reps - Seated Long Arc Quad  - 1 x daily - 7 x weekly - 3 sets - 10 reps - Seated Knee Flexion Extension AROM (seated heel slide with strap)  - 1 x daily - 7 x weekly - 3 sets - 10 reps    ADDED 08/02/22: PRONE knee flexion, bridging  ASSESSMENT:   CLINICAL IMPRESSION: Patient continues to express no limitation in function most of the time today.  If he sitting for example in the car, he has increased stiffness at the end of the day.  Discussed goal of knee flexion.  Right knee is only able to bend to 105 degrees.  Modified left knee flexion goal to reflect a more realistic range of motion and  the timeframe offered.  He plans to finish plan of care to maximize outcomes and function.  I also discussed with him the benefit of joining.  The gym in which he can use both here in town and when he travels Mus[employer]).  OBJECTIVE IMPAIRMENTS Abnormal gait, decreased activity tolerance, decreased endurance, decreased mobility, difficulty walking, decreased ROM, decreased strength, hypomobility, increased edema, and pain.    ACTIVITY LIMITATIONS carrying, lifting, bending, standing, squatting, transfers, and  dressing   PARTICIPATION LIMITATIONS: driving, community activity, and occupation   PERSONAL FACTORS  None  are also affecting patient's functional outcome.    REHAB POTENTIAL: Good   CLINICAL DECISION MAKING: Stable/uncomplicated   EVALUATION COMPLEXITY: Low     GOALS: Goals reviewed with patient? Yes   SHORT TERM GOALS: Target date: 07/02/2022  Pt will demonstrate appropriate understanding and performance of initially prescribed HEP in order to facilitate improved independence with management of symptoms.  Baseline: HEP provided on eval 07/25/22: good performance, limited compliance d/t business Goal status: MET   2. Pt will score greater than or equal to 54 on FOTO in order to demonstrate improved perception of function due to symptoms.            Baseline: 49  07/25/22: 73%            Goal status: MET   LONG TERM GOALS: Target date: 07/30/2022    Pt will score 62 or greater on FOTO in order to demonstrate improved perception of function due to symptoms.  Baseline: 49 07/25/22: 73% Goal status: MET   2.  Pt will demonstrate at least 0-105 degrees of L knee AROM in order to facilitate improved tolerance to functional movements such as walking and lifting.  Baseline: -8 ext, 78 flexion 07/25/22: passive to 105 deg, AROM 95-98 deg.  Goal status: ONGOING   3.  Pt will be able to lift up to 10# in order to demonstrate improved capacity for daily activities such as  lifting/carrying drapery for work activities.  Baseline: NT due to proximity to surgery Goal status: INITIAL   4.  Pt will be able to perform 5xSTS in less than or equal to 11sec with appropriate mechanics in order to demonstrate reduced fall risk and improved functional independence (MCID 5xSTS = 2.3 sec). Baseline: 14 sec, with significant anterior placement of LLE Goal status: INITIAL     PLAN: PT FREQUENCY: 2x/week   PT DURATION: 8 weeks   PLANNED INTERVENTIONS: Therapeutic exercises, Therapeutic activity, Neuromuscular re-education, Balance training, Gait training, Patient/Family education, Self Care, Joint mobilization, Stair training, DME instructions, Dry Needling, Electrical stimulation, Cryotherapy, Moist heat, Vasopneumatic device, Manual therapy, and Re-evaluation   PLAN FOR NEXT SESSION:   Check goals continue lifting/strength.  leg press/wall sit and manual therapy   Raeford Razor, PT 08/29/22 12:41 PM Phone: [phone] Fax: [phone]

## 2022-08-30 ENCOUNTER — Ambulatory Visit: Payer: Medicare Other | Admitting: Physical Therapy

## 2022-09-04 ENCOUNTER — Ambulatory Visit: Payer: Medicare Other | Admitting: Physical Therapy

## 2022-09-05 NOTE — Therapy (Signed)
OUTPATIENT PHYSICAL THERAPY TREATMENT NOTE + PROGRESS NOTE   Patient Name: Levi Strickland MRN: [id number] DOB:[birth year], 66 y.o., male Today's Date: 09/06/2022  Progress Note Reporting Period 06/14/22 to 09/06/22  See note below for Objective Data and Assessment of Progress/Goals.       PCP: none in chart   REFERRING PROVIDER: Gwenlyn Found MD  END OF SESSION:   PT End of Session - 09/06/22 0936     Visit Number 14    Number of Visits 17    Date for PT Re-Evaluation 09/27/22    Authorization Type Medicare AB    PT Start Time [phone]   pt late arrival   PT Stop Time 1011    PT Time Calculation (min) 34 min    Activity Tolerance Patient tolerated treatment well;No increased pain    Behavior During Therapy [facility] for tasks assessed/performed                History reviewed. No pertinent past medical history. Past Surgical History:  Procedure Laterality Date   JOINT REPLACEMENT     There are no problems to display for this patient.   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 Notes that work has caused him to miss appointments which he feels has limited his progress. States he would like to extend therapy if possible to maximize knee ROM.   PAIN:  Are you having pain: stiffness, no pain  0/10 Location: medial L knee How would you describe your pain? Sharp, deep Aggravating factors: lifting/carrying, prolonged sitting/driving pending on positioning Easing factors: ice, elevated   OBJECTIVE:  (objective measures completed at initial evaluation unless otherwise dated)   DIAGNOSTIC FINDINGS:  None in chart     PATIENT SURVEYS:  FOTO 49 FOTO 07/25/22: 73%    COGNITION:           Overall cognitive status: Within functional limits for tasks assessed                          SENSATION: Does not endorse any sensory deficits   EDEMA:  Generalized swelling L knee joint - figure 8 deferred as pt arrives with dressing on incision - appears clean and intact, no concerns.       PALPATION: Palpable tightness L quad/TFL and hip musculature   LOWER EXTREMITY ROM:   Active/Passive ROM Right eval Left eval Left 07/03/22 07/10/22 Left passive only 07/25/22 L AROM/PROM 08/21/22  PROM  09/06/22 L AROM/PROM  Knee flexion (Addended 105 deg)  78/85 95/96 98 94/100 105 best  103/105p!  Knee extension 0 -8/-5 0/0 mild subjective tightness  WNL     (Blank rows = not tested)   Comments: Performed passive physiological movement flex/extension of L knee with gentle oscillations to reduce muscle guarding, pt reports good relief       LOWER EXTREMITY MMT:     MMT Right eval Left eval Lt 08/02/22 Left 09/06/22  Hip flexion _0 Hip abduction (modified sitting) 5 5 4+ 5  Knee flexion  5 5  Knee extension     5 5   (Blank rows = not tested)   Comments: strain with L hip flexion, no pain. Knee MMT deferred given proximity to surgery     FUNCTIONAL TESTS:  5 times sit to stand: 14 sec STS   - standard chair, no UE support. Significant anterior positioning of LLE and weight shift to RLE noted 09/06/22: 5xSTS 9sec standard chair even BOS no UE support   GAIT: Distance walked: within clinic Assistive device utilized: Single point cane Level of assistance: Modified independence Comments: partial step through pattern, SPC in RUE, fwd flexed posture, reduced stance time and step length on LLE       TODAY'S TREATMENT: [facility] Adult PT Treatment:                                                 DATE: 09/06/22 Therapeutic Exercise: Recumbent bike 24mn during subjective, full revolutions STS from chair [id number] cues for pacing and ascent velocity  SL machine knee ext 15# 2x8 cues for form and pacing SL hamstring curl machine 20# 2x8 cues for form and pacing  Therapeutic Activity: Pt education re: progress note 5xSTS + education Floor lift 5# x5 and 10# x5 no issues, cues for squat Front carry 10# [id number] for pacing   [facility]                                                DATE: 08[facility] Therapeutic Exercise: Recumbent bike 5 min for full revolutions Seated AAROM L knee  Hamstring curl with green band  x 15 LAQ  5 lbs x 20   Sit to stand 15 lbs x 15 Staggered sit to stand 15 lb on LLE x 15  Lunge 15 lbs x 10  Step up with Rt knee march 15 lbs x 15 (6 inch)  Lateral step up to hip abduction (6 inch)  Airex standing: tandem with head movements in flexion, extension , cervical rotation and eyes closed, done with each leg forward x 30 sec  Airex marching x 15  Lunge with L foot on Airex added small head turns and eyes closed  Leg press 2 plates double leg then LLE x 15 each  Leg press calf stretch then heel raise x 15  Manual Therapy: Seated PROM L knee flexion edge of elevated mat, GR II-III flexion mobs    [facility] Adult PT Treatment:                                                DATE: 08/23/22 Therapeutic Exercise: Recumbant bike no tension for full revolution x 6 min  Wall squat/sitting with ball behind back  x 15 Wall sit 30 sec on ball x 3  Reverse glider lunge holding wall for hip strength  x 10 each  Piriformis seated  Hamstring curl bilateral x 15 Bridging with ball 2 x 15 (hamstring/glute)  Bridge add knee flexion x 5   Knee AAROM flexion and extension  Manual Therapy: PROM L knee flexion ,  Gr I-III mobilization knee flexion  IASTM L thigh with LLE off table     PATIENT EDUCATION:  Education details: HEP  performance, importance of adherence, rationale for interventions throughout, AROM and progress. PT POC Person educated: Patient Education method: Explanation, Demonstration, Tactile cues, Verbal cues Education comprehension: verbalized understanding, returned demonstration, verbal cues required, tactile cues required, and needs further education      HOME EXERCISE PROGRAM: Access Code: [email or web address] URL: [email or web address] Date: 06/04/2022 Prepared by: Enis Slipper   Exercises - Supine Quad Set  - 1 x daily - 7 x weekly - 3 sets - 10 reps - Seated Long Arc Quad  - 1 x daily - 7 x weekly - 3 sets - 10 reps - Seated Knee Flexion Extension AROM (seated heel slide with strap)  - 1 x daily - 7 x weekly - 3 sets - 10 reps    ADDED 08/02/22: PRONE knee flexion, bridging  ASSESSMENT:   CLINICAL IMPRESSION: Pt arrives without pain, denies any significant pain or limitations in most daily activities but does report pain/stiffness with prolonged positioning. LTG addressed as below, notable improvement in 5xSTS score and functional lifting. Pt no longer at risk of falls based on 5xSTS time. Primary deficit at this point remains knee flexion ROM which is painful/stiff although pt notes it does not tend to limit him functionally. Discussion re: remaining deficits, functional progress, and PT POC w/ goal to eventually transition to independent gym program and stretching routine. Pt tolerates session well without adverse event. Pt departs today's session in no acute distress, all voiced questions/concerns addressed appropriately from PT perspective.     OBJECTIVE IMPAIRMENTS Abnormal gait, decreased activity tolerance, decreased endurance, decreased mobility, difficulty walking, decreased ROM, decreased strength, hypomobility, increased edema, and pain.    ACTIVITY LIMITATIONS carrying, lifting, bending, standing, squatting, transfers, and dressing   PARTICIPATION LIMITATIONS: driving,  community activity, and occupation   PERSONAL FACTORS  None  are also affecting patient's functional outcome.    REHAB POTENTIAL: Good   CLINICAL DECISION MAKING: Stable/uncomplicated   EVALUATION COMPLEXITY: Low     GOALS: Goals reviewed with patient? Yes   SHORT TERM GOALS: Target date: 07/02/2022  Pt will demonstrate appropriate understanding and performance of initially prescribed HEP in order to facilitate improved independence with management of symptoms.  Baseline: HEP provided on eval 07/25/22: good performance, limited compliance d/t business Goal status: MET   2. Pt will score greater than or equal to 54 on FOTO in order to demonstrate improved perception of function due to symptoms.            Baseline: 49  07/25/22: 73%            Goal status: MET   LONG TERM GOALS: Target date: 07/30/2022    Pt will score 62 or greater on FOTO in order to demonstrate improved perception of function due to symptoms.  Baseline: 49 07/25/22: 73% Goal status: MET   2.  Pt will demonstrate at least 0-105 degrees of L knee AROM in order to facilitate improved tolerance to functional movements such as walking and lifting.  Baseline: -8 ext, 78 flexion 07/25/22: passive to 105 deg, AROM 95-98 deg.  09/06/22: AROM 103, PROM 106 and painful  Goal status: ONGOING   3.  Pt will be able to lift up to 10# in order to demonstrate improved capacity for daily activities such as lifting/carrying drapery for work activities.  Baseline: NT due to proximity to surgery 09/06/22: 10# 5  reps no difficulty Goal status: MET   4.  Pt will be able to perform 5xSTS in less than or equal to 11sec with appropriate mechanics in order to demonstrate reduced fall risk and improved functional independence (MCID 5xSTS = 2.3 sec). Baseline: 14 sec, with significant anterior placement of LLE 09/06/22: 9 sec no UE support, standard chair   Goal status: MET     PLAN: PT FREQUENCY: 2x/week   PT DURATION: 8 weeks    PLANNED INTERVENTIONS: Therapeutic exercises, Therapeutic activity, Neuromuscular re-education, Balance training, Gait training, Patient/Family education, Self Care, Joint mobilization, Stair training, DME instructions, Dry Needling, Electrical stimulation, Cryotherapy, Moist heat, Vasopneumatic device, Manual therapy, and Re-evaluation   PLAN FOR NEXT SESSION:   continue functional activity and knee flexion emphasis  Leeroy Cha PT, DPT 09/06/2022 2:18 PM

## 2022-09-06 ENCOUNTER — Ambulatory Visit: Payer: Medicare Other | Admitting: Physical Therapy

## 2022-09-06 ENCOUNTER — Encounter: Payer: Self-pay | Admitting: Physical Therapy

## 2022-09-06 DIAGNOSIS — R2689 Other abnormalities of gait and mobility: Secondary | ICD-10-CM

## 2022-09-06 DIAGNOSIS — M25562 Pain in left knee: Secondary | ICD-10-CM

## 2022-09-06 DIAGNOSIS — M25662 Stiffness of left knee, not elsewhere classified: Secondary | ICD-10-CM

## 2022-09-06 DIAGNOSIS — R609 Edema, unspecified: Secondary | ICD-10-CM

## 2022-09-11 ENCOUNTER — Ambulatory Visit: Payer: Medicare Other | Admitting: Physical Therapy

## 2022-09-12 NOTE — Therapy (Incomplete)
OUTPATIENT PHYSICAL THERAPY TREATMENT NOTE    Patient Name: Levi Strickland MRN: [id number] DOB:01-17-1956, 66 y.o., male Today's Date: 09/12/2022   PCP: none in chart   REFERRING PROVIDER: Gwenlyn Found MD  END OF SESSION:        No past medical history on file. Past Surgical History:  Procedure Laterality Date   JOINT REPLACEMENT     There are no problems to display for this patient.   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  Rationale for Evaluation and Treatment Rehabilitation  PERTINENT HISTORY: Previous MSK surgeries  PRECAUTIONS: s/p TKA 05/22/22  SUBJECTIVE:   ***  *** Pt denies any significant pain except for when prolonged sitting in flexion and when aggressively stretching. Notes that work has caused him to miss appointments which he feels has limited his progress. States he would like to extend therapy if possible to maximize knee ROM.   PAIN:  Are you having pain: stiffness, no pain  0/10 Location: medial L knee How would you describe your pain? Sharp, deep Aggravating factors: lifting/carrying, prolonged sitting/driving pending on positioning Easing factors: ice, elevated   OBJECTIVE: (objective measures completed at initial evaluation unless otherwise dated)   DIAGNOSTIC FINDINGS:  None in chart     PATIENT SURVEYS:  FOTO 49 FOTO 07/25/22: 73%    COGNITION:           Overall cognitive status: Within functional limits for tasks assessed                          SENSATION: Does not endorse any sensory deficits   EDEMA:  Generalized swelling L knee joint - figure 8 deferred as pt arrives with dressing on incision - appears clean and intact, no concerns.       PALPATION: Palpable tightness L quad/TFL and hip musculature   LOWER EXTREMITY ROM:   Active/Passive ROM Right eval Left eval Left 07/03/22 07/10/22 Left passive only 07/25/22 L AROM/PROM  08/21/22  PROM  09/06/22 L AROM/PROM  Knee flexion (Addended 105 deg)  78/85 95/96 98 94/100 105 best  103/105p!  Knee extension 0 -8/-5 0/0 mild subjective tightness  WNL     (Blank rows = not tested)   Comments: Performed passive physiological movement flex/extension of L knee with gentle oscillations to reduce muscle guarding, pt reports good relief       LOWER EXTREMITY MMT:     MMT Right eval Left eval Lt 08/02/22 Left 09/06/22  Hip flexion _0 Hip abduction (modified sitting) 5 5 4+ 5  Knee flexion     5 5  Knee extension     5 5   (Blank rows = not tested)   Comments: strain with L hip flexion, no pain. Knee MMT deferred given proximity to surgery     FUNCTIONAL TESTS:  5 times sit to stand: 14 sec STS   - standard chair, no UE support. Significant anterior positioning of LLE and weight shift to RLE noted 09/06/22: 5xSTS 9sec standard chair even BOS no UE support   GAIT: Distance walked: within clinic Assistive device utilized: Single point cane Level of assistance: Modified independence Comments: partial step through pattern, SPC in RUE, fwd flexed posture, reduced stance time and step length on LLE       TODAY'S TREATMENT: [facility] Adult PT Treatment:  DATE: 09/13/22 Therapeutic Exercise: *** Manual Therapy: *** Neuromuscular re-ed: *** Therapeutic Activity: *** Modalities: *** Self Care: ***   [name] Adult PT Treatment:                                                DATE: 09/06/22 Therapeutic Exercise: Recumbent bike 83mn during subjective, full revolutions STS from chair [id number]# cues for pacing and ascent velocity  SL machine knee ext 15# 2x8 cues for form and pacing SL hamstring curl machine 20# 2x8 cues for form and pacing  Therapeutic Activity: Pt education re: progress note 5xSTS + education Floor lift 5# x5 and 10# x5 no issues, cues for squat Front carry 10# [id number] for  pacing   [facility]                                                DATE: 08[facility]/22 Therapeutic Exercise: Recumbent bike 5 min for full revolutions Seated AAROM L knee  Hamstring curl with green band  x 15 LAQ  5 lbs x 20   Sit to stand 15 lbs x 15 Staggered sit to stand 15 lb on LLE x 15  Lunge 15 lbs x 10  Step up with Rt knee march 15 lbs x 15 (6 inch)  Lateral step up to hip abduction (6 inch)  Airex standing: tandem with head movements in flexion, extension , cervical rotation and eyes closed, done with each leg forward x 30 sec  Airex marching x 15  Lunge with L foot on Airex added small head turns and eyes closed  Leg press 2 plates double leg then LLE x 15 each  Leg press calf stretch then heel raise x 15  Manual Therapy: Seated PROM L knee flexion edge of elevated mat, GR II-III flexion mobs    PATIENT EDUCATION:  Education details: HEP performance, importance of adherence, rationale for interventions throughout, AROM and progress. PT POC Person educated: Patient Education method: Explanation, Demonstration, Tactile cues, Verbal cues Education comprehension: verbalized understanding, returned demonstration, verbal cues required, tactile cues required, and needs further education      HOME EXERCISE PROGRAM: Access Code: [email or web address]: [email or web address] Date: 06/04/2022 Prepared by: DaviEnis Slipperxercises - Supine Quad Set  - 1 x daily - 7 x weekly - 3 sets - 10 reps - Seated Long Arc Quad  - 1 x daily - 7 x weekly - 3 sets - 10 reps - Seated Knee Flexion Extension AROM (seated heel slide with strap)  - 1 x daily - 7 x weekly - 3 sets - 10 reps    ADDED 08/02/22: PRONE knee flexion, bridging  ASSESSMENT:   CLINICAL IMPRESSION: ***  *** Pt arrives without pain, denies any significant pain or limitations in most daily activities but does report pain/stiffness with prolonged positioning. LTG addressed as below, notable improvement in  5xSTS score and functional lifting. Pt no longer at risk of falls based on 5xSTS time. Primary deficit at this point remains knee flexion ROM which is painful/stiff although pt notes it does not tend to limit him functionally. Discussion re: remaining deficits, functional progress, and PT POC w/ goal to eventually transition to independent gym  program and stretching routine. Pt tolerates session well without adverse event. Pt departs today's session in no acute distress, all voiced questions/concerns addressed appropriately from PT perspective.     OBJECTIVE IMPAIRMENTS Abnormal gait, decreased activity tolerance, decreased endurance, decreased mobility, difficulty walking, decreased ROM, decreased strength, hypomobility, increased edema, and pain.    ACTIVITY LIMITATIONS carrying, lifting, bending, standing, squatting, transfers, and dressing   PARTICIPATION LIMITATIONS: driving, community activity, and occupation   PERSONAL FACTORS  None  are also affecting patient's functional outcome.    REHAB POTENTIAL: Good   CLINICAL DECISION MAKING: Stable/uncomplicated   EVALUATION COMPLEXITY: Low     GOALS: Goals reviewed with patient? Yes   SHORT TERM GOALS: Target date: 07/02/2022  Pt will demonstrate appropriate understanding and performance of initially prescribed HEP in order to facilitate improved independence with management of symptoms.  Baseline: HEP provided on eval 07/25/22: good performance, limited compliance d/t business Goal status: MET   2. Pt will score greater than or equal to 54 on FOTO in order to demonstrate improved perception of function due to symptoms.            Baseline: 49  07/25/22: 73%            Goal status: MET   LONG TERM GOALS: Target date: 07/30/2022    Pt will score 62 or greater on FOTO in order to demonstrate improved perception of function due to symptoms.  Baseline: 49 07/25/22: 73% Goal status: MET   2.  Pt will demonstrate at least 0-105 degrees  of L knee AROM in order to facilitate improved tolerance to functional movements such as walking and lifting.  Baseline: -8 ext, 78 flexion 07/25/22: passive to 105 deg, AROM 95-98 deg.  09/06/22: AROM 103, PROM 106 and painful  Goal status: ONGOING   3.  Pt will be able to lift up to 10# in order to demonstrate improved capacity for daily activities such as lifting/carrying drapery for work activities.  Baseline: NT due to proximity to surgery 09/06/22: 10# 5 reps no difficulty Goal status: MET   4.  Pt will be able to perform 5xSTS in less than or equal to 11sec with appropriate mechanics in order to demonstrate reduced fall risk and improved functional independence (MCID 5xSTS = 2.3 sec). Baseline: 14 sec, with significant anterior placement of LLE 09/06/22: 9 sec no UE support, standard chair   Goal status: MET     PLAN: PT FREQUENCY: 2x/week   PT DURATION: 8 weeks   PLANNED INTERVENTIONS: Therapeutic exercises, Therapeutic activity, Neuromuscular re-education, Balance training, Gait training, Patient/Family education, Self Care, Joint mobilization, Stair training, DME instructions, Dry Needling, Electrical stimulation, Cryotherapy, Moist heat, Vasopneumatic device, Manual therapy, and Re-evaluation   PLAN FOR NEXT SESSION:  *** continue functional activity and knee flexion emphasis  Leeroy Cha PT, DPT 09/12/2022 8:46 AM

## 2022-09-13 ENCOUNTER — Ambulatory Visit: Payer: Medicare Other | Admitting: Physical Therapy

## 2022-09-17 NOTE — Therapy (Deleted)
OUTPATIENT PHYSICAL THERAPY TREATMENT NOTE + PROGRESS NOTE   Patient Name: Levi Strickland MRN: [id number] DOB:1956-07-08, 66 y.o., male Today's Date: 09/17/2022  Progress Note Reporting Period 06/14/22 to 09/06/22  See note below for Objective Data and Assessment of Progress/Goals.       PCP: none in chart   REFERRING PROVIDER: Gwenlyn Found MD  END OF SESSION:        No past medical history on file. Past Surgical History:  Procedure Laterality Date   JOINT REPLACEMENT     There are no problems to display for this patient.   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 Notes that work has caused him to miss appointments which he feels has limited his progress. States he would like to extend therapy if possible to maximize knee ROM.   PAIN:  Are you having pain: stiffness, no pain  0/10 Location: medial L knee How would you describe your pain? Sharp, deep Aggravating factors: lifting/carrying, prolonged sitting/driving pending on positioning Easing factors: ice, elevated   OBJECTIVE: (objective measures completed at initial evaluation unless otherwise dated)   DIAGNOSTIC FINDINGS:  None in chart     PATIENT SURVEYS:  FOTO 49 FOTO 07/25/22: 73%    COGNITION:           Overall cognitive status: Within functional limits for tasks assessed                          SENSATION: Does not endorse any sensory deficits   EDEMA:  Generalized swelling L knee joint - figure 8 deferred as pt arrives with dressing on incision - appears clean and intact, no concerns.       PALPATION: Palpable tightness L quad/TFL and hip musculature    LOWER EXTREMITY ROM:   Active/Passive ROM Right eval Left eval Left 07/03/22 07/10/22 Left passive only 07/25/22 L AROM/PROM 08/21/22  PROM  09/06/22 L AROM/PROM  Knee flexion (Addended 105 deg)  78/85 95/96 98 94/100 105 best  103/105p!  Knee extension 0 -8/-5 0/0 mild subjective tightness  WNL     (Blank rows = not tested)   Comments: Performed passive physiological movement flex/extension of L knee with gentle oscillations to reduce muscle guarding, pt reports good relief       LOWER EXTREMITY MMT:     MMT Right eval Left eval Lt 08/02/22 Left 09/06/22  Hip flexion _0 Hip abduction (modified sitting) 5 5 4+ 5  Knee flexion     5 5  Knee extension     5 5   (Blank rows = not tested)   Comments: strain with L hip flexion, no pain. Knee MMT deferred given proximity to surgery     FUNCTIONAL TESTS:  5 times sit to stand: 14 sec STS   - standard chair, no UE support. Significant anterior positioning of LLE and weight shift to RLE noted 09/06/22: 5xSTS 9sec standard chair even BOS no UE support   GAIT: Distance walked: within clinic Assistive device utilized: Single point cane Level of assistance: Modified independence Comments: partial step through pattern, SPC in RUE, fwd flexed posture, reduced stance time and step length on LLE  TODAY'S TREATMENT: [facility] Adult PT Treatment:                                                DATE: 09/06/22 Therapeutic Exercise: Recumbent bike 21mn during subjective, full revolutions STS from chair [id number] cues for pacing and ascent velocity  SL machine knee ext 15# 2x8 cues for form and pacing SL hamstring curl machine 20# 2x8 cues for form and pacing  Therapeutic Activity: Pt education re: progress note 5xSTS + education Floor lift 5# x5 and 10# x5 no issues, cues for squat Front carry 10# [id number]# [id number] for pacing   [facility]                                                DATE:  08/29/22 Therapeutic Exercise: Recumbent bike 5 min for full revolutions Seated AAROM L knee  Hamstring curl with green band  x 15 LAQ  5 lbs x 20   Sit to stand 15 lbs x 15 Staggered sit to stand 15 lb on LLE x 15  Lunge 15 lbs x 10  Step up with Rt knee march 15 lbs x 15 (6 inch)  Lateral step up to hip abduction (6 inch)  Airex standing: tandem with head movements in flexion, extension , cervical rotation and eyes closed, done with each leg forward x 30 sec  Airex marching x 15  Lunge with L foot on Airex added small head turns and eyes closed  Leg press 2 plates double leg then LLE x 15 each  Leg press calf stretch then heel raise x 15  Manual Therapy: Seated PROM L knee flexion edge of elevated mat, GR II-III flexion mobs    [facility] Adult PT Treatment:                                                DATE: 08/23/22 Therapeutic Exercise: Recumbant bike no tension for full revolution x 6 min  Wall squat/sitting with ball behind back  x 15 Wall sit 30 sec on ball x 3  Reverse glider lunge holding wall for hip strength  x 10 each  Piriformis seated  Hamstring curl bilateral x 15 Bridging with ball 2 x 15 (hamstring/glute)  Bridge add knee flexion x 5   Knee AAROM flexion and extension  Manual Therapy: PROM L knee flexion , Gr I-III mobilization knee flexion  IASTM L thigh with LLE off table     PATIENT EDUCATION:  Education details: HEP performance, importance of adherence, rationale for interventions throughout, AROM and progress. PT POC Person educated: Patient Education method: Explanation, Demonstration, Tactile cues, Verbal cues Education comprehension: verbalized understanding, returned demonstration, verbal cues required, tactile cues required, and needs further education      HOME EXERCISE PROGRAM: Access Code: [email or web address]: [email or web address] Date: 06/04/2022 Prepared by: DaviEnis Slipperxercises - Supine Quad Set  - 1 x daily - 7 x weekly - 3  sets - 10 reps - Seated Long Arc Quad  - 1 x daily - 7 x weekly - 3  sets - 10 reps - Seated Knee Flexion Extension AROM (seated heel slide with strap)  - 1 x daily - 7 x weekly - 3 sets - 10 reps    ADDED 08/02/22: PRONE knee flexion, bridging  ASSESSMENT:   CLINICAL IMPRESSION: Pt arrives without pain, denies any significant pain or limitations in most daily activities but does report pain/stiffness with prolonged positioning. LTG addressed as below, notable improvement in 5xSTS score and functional lifting. Pt no longer at risk of falls based on 5xSTS time. Primary deficit at this point remains knee flexion ROM which is painful/stiff although pt notes it does not tend to limit him functionally. Discussion re: remaining deficits, functional progress, and PT POC w/ goal to eventually transition to independent gym program and stretching routine. Pt tolerates session well without adverse event. Pt departs today's session in no acute distress, all voiced questions/concerns addressed appropriately from PT perspective.     OBJECTIVE IMPAIRMENTS Abnormal gait, decreased activity tolerance, decreased endurance, decreased mobility, difficulty walking, decreased ROM, decreased strength, hypomobility, increased edema, and pain.    ACTIVITY LIMITATIONS carrying, lifting, bending, standing, squatting, transfers, and dressing   PARTICIPATION LIMITATIONS: driving, community activity, and occupation   PERSONAL FACTORS  None  are also affecting patient's functional outcome.    REHAB POTENTIAL: Good   CLINICAL DECISION MAKING: Stable/uncomplicated   EVALUATION COMPLEXITY: Low     GOALS: Goals reviewed with patient? Yes   SHORT TERM GOALS: Target date: 07/02/2022  Pt will demonstrate appropriate understanding and performance of initially prescribed HEP in order to facilitate improved independence with management of symptoms.  Baseline: HEP provided on eval 07/25/22: good performance, limited compliance  d/t business Goal status: MET   2. Pt will score greater than or equal to 54 on FOTO in order to demonstrate improved perception of function due to symptoms.            Baseline: 49  07/25/22: 73%            Goal status: MET   LONG TERM GOALS: Target date: 07/30/2022    Pt will score 62 or greater on FOTO in order to demonstrate improved perception of function due to symptoms.  Baseline: 49 07/25/22: 73% Goal status: MET   2.  Pt will demonstrate at least 0-105 degrees of L knee AROM in order to facilitate improved tolerance to functional movements such as walking and lifting.  Baseline: -8 ext, 78 flexion 07/25/22: passive to 105 deg, AROM 95-98 deg.  09/06/22: AROM 103, PROM 106 and painful  Goal status: ONGOING   3.  Pt will be able to lift up to 10# in order to demonstrate improved capacity for daily activities such as lifting/carrying drapery for work activities.  Baseline: NT due to proximity to surgery 09/06/22: 10# 5 reps no difficulty Goal status: MET   4.  Pt will be able to perform 5xSTS in less than or equal to 11sec with appropriate mechanics in order to demonstrate reduced fall risk and improved functional independence (MCID 5xSTS = 2.3 sec). Baseline: 14 sec, with significant anterior placement of LLE 09/06/22: 9 sec no UE support, standard chair   Goal status: MET     PLAN: PT FREQUENCY: 2x/week   PT DURATION: 8 weeks   PLANNED INTERVENTIONS: Therapeutic exercises, Therapeutic activity, Neuromuscular re-education, Balance training, Gait training, Patient/Family education, Self Care, Joint mobilization, Stair training, DME instructions, Dry Needling, Electrical stimulation, Cryotherapy, Moist heat, Vasopneumatic device, Manual therapy, and Re-evaluation   PLAN FOR NEXT  SESSION:   continue functional activity and knee flexion emphasis  Leeroy Cha PT, DPT 09/17/2022 11:39 AM

## 2022-09-18 ENCOUNTER — Ambulatory Visit: Payer: Medicare Other | Admitting: Physical Therapy

## 2022-09-20 ENCOUNTER — Encounter: Payer: Self-pay | Admitting: Physical Therapy

## 2022-09-20 ENCOUNTER — Ambulatory Visit: Payer: Medicare Other | Admitting: Physical Therapy

## 2022-09-20 DIAGNOSIS — R2689 Other abnormalities of gait and mobility: Secondary | ICD-10-CM

## 2022-09-20 DIAGNOSIS — M25662 Stiffness of left knee, not elsewhere classified: Secondary | ICD-10-CM

## 2022-09-20 DIAGNOSIS — R609 Edema, unspecified: Secondary | ICD-10-CM

## 2022-09-20 DIAGNOSIS — M25562 Pain in left knee: Secondary | ICD-10-CM | POA: Diagnosis not present

## 2022-09-20 NOTE — Therapy (Signed)
OUTPATIENT PHYSICAL THERAPY TREATMENT NOTE  Patient Name: Levi Strickland MRN: [id number] DOB:Sep 06, 1956, 66 y.o., male Today's Date: 09/20/2022       PCP: none in chart   REFERRING PROVIDER: Gwenlyn Found MD  END OF SESSION:   PT End of Session - 09/20/22 0805     Visit Number 15    Number of Visits 17    Date for PT Re-Evaluation 09/27/22    Authorization Type Medicare AB    PT Start Time 0800    PT Stop Time 0845    PT Time Calculation (min) 45 min    Activity Tolerance Patient tolerated treatment well;No increased pain    Behavior During Therapy [facility] for tasks assessed/performed                 History reviewed. No pertinent past medical history. Past Surgical History:  Procedure Laterality Date   JOINT REPLACEMENT     There are no problems to display for this patient.   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 The workload of work has reduced.  I'd like to continue PT for my knee I am not where I want to be with bending it.  I think I need to do more at home.   PAIN:  Are you having pain: stiffness, no pain  0/10 Location: medial L knee How would you describe your pain? Sharp, deep Aggravating factors: lifting/carrying, prolonged sitting/driving pending on positioning Easing factors: ice, elevated   OBJECTIVE: (objective measures completed at initial evaluation unless otherwise dated)   DIAGNOSTIC FINDINGS:  None in chart     PATIENT SURVEYS:  FOTO 49 FOTO 07/25/22: 73%    COGNITION:           Overall  cognitive status: Within functional limits for tasks assessed                          SENSATION: Does not endorse any sensory deficits   EDEMA:  Generalized swelling L knee joint - figure 8 deferred as pt arrives with dressing on incision - appears clean and intact, no concerns.       PALPATION: Palpable tightness L quad/TFL and hip musculature   LOWER EXTREMITY ROM:   Active/Passive ROM Right eval Left eval Left 07/03/22 07/10/22 Left passive only 07/25/22 L AROM/PROM 08/21/22  PROM  09/06/22 L AROM/PROM  Knee flexion (Addended 105 deg)  78/85 95/96 98 94/100 105 best  103/105p!  Knee extension 0 -8/-5 0/0 mild subjective tightness  WNL     (Blank rows = not tested)   Comments: Performed passive physiological movement flex/extension of L knee with gentle oscillations to reduce muscle guarding, pt reports good relief       LOWER EXTREMITY MMT:     MMT Right eval Left eval Lt 08/02/22 Left 09/06/22  Hip flexion _0 Hip abduction (modified sitting) 5 5 4+ 5  Knee flexion     5 5  Knee extension     5 5   (Blank rows = not tested)  Comments: strain with L hip flexion, no pain. Knee MMT deferred given proximity to surgery     FUNCTIONAL TESTS:  5 times sit to stand: 14 sec STS   - standard chair, no UE support. Significant anterior positioning of LLE and weight shift to RLE noted 09/06/22: 5xSTS 9sec standard chair even BOS no UE support   GAIT: Distance walked: within clinic Assistive device utilized: Single point cane Level of assistance: Modified independence Comments: partial step through pattern, SPC in RUE, fwd flexed posture, reduced stance time and step length on LLE       TODAY'S TREATMENT:  [facility] Adult PT Treatment:                                                DATE: 09/20/22 Therapeutic Exercise: NuStep L7 LE only for 6 min  Knee ROM: extension and flexion using strap to assist  Hamstring and ITB, adductor stretching  Knee extension banded  blue x 45 sec Seated TB knee ext and flexion x 45 sec each  Isometric wall sit 25 lbs x 45 sec Added variations to ankle to increase quad work 25 lbs KB  Wall squat 25 lbs KB full range x 15   B stance lunge with and without wgt Dead lift 25 lbs mod cues for form  Manual Therapy: PROM L knee flexion , Gr I-III mobilization knee flexion  Supine and seated to tolerance      [facility] Adult PT Treatment:                                                DATE: 09/06/22 Therapeutic Exercise: Recumbent bike 53mn during subjective, full revolutions STS from chair [id number] cues for pacing and ascent velocity  SL machine knee ext 15# 2x8 cues for form and pacing SL hamstring curl machine 20# 2x8 cues for form and pacing  Therapeutic Activity: Pt education re: progress note 5xSTS + education Floor lift 5# x5 and 10# x5 no issues, cues for squat Front carry 10# [id number] for pacing   [facility]:                                                DATE: 08/29/22 Therapeutic Exercise: Recumbent bike 5 min for full revolutions Seated AAROM L knee  Hamstring curl with green band  x 15 LAQ  5 lbs x 20   Sit to stand 15 lbs x 15 Staggered sit to stand 15 lb on LLE x 15  Lunge 15 lbs x 10  Step up with Rt knee march 15 lbs x 15 (6 inch)  Lateral step up to hip abduction (6 inch)  Airex standing: tandem with head movements in flexion, extension , cervical rotation and eyes closed, done with each leg forward x 30 sec  Airex marching x 15  Lunge with L foot on Airex added small head turns and eyes closed  Leg press 2 plates double leg then LLE x 15 each  Leg press calf stretch then heel raise x 15  Manual Therapy: Seated  PROM L knee flexion edge of elevated mat, GR II-III flexion mobs    [facility] Adult PT Treatment:                                                DATE: 08/23/22 Therapeutic Exercise: Recumbant bike no tension for full revolution x 6 min  Wall squat/sitting with  ball behind back  x 15 Wall sit 30 sec on ball x 3  Reverse glider lunge holding wall for hip strength  x 10 each  Piriformis seated  Hamstring curl bilateral x 15 Bridging with ball 2 x 15 (hamstring/glute)  Bridge add knee flexion x 5   Knee AAROM flexion and extension  Manual Therapy: PROM L knee flexion , Gr I-III mobilization knee flexion  IASTM L thigh with LLE off table     PATIENT EDUCATION:  Education details: HEP performance, importance of adherence, rationale for interventions throughout, AROM and progress. PT POC Person educated: Patient Education method: Explanation, Demonstration, Tactile cues, Verbal cues Education comprehension: verbalized understanding, returned demonstration, verbal cues required, tactile cues required, and needs further education      HOME EXERCISE PROGRAM: Access Code: [email or web address] URL: [email or web address] Date: 06/04/2022 Prepared by: Enis Slipper   Exercises - Supine Quad Set  - 1 x daily - 7 x weekly - 3 sets - 10 reps - Seated Long Arc Quad  - 1 x daily - 7 x weekly - 3 sets - 10 reps - Seated Knee Flexion Extension AROM (seated heel slide with strap)  - 1 x daily - 7 x weekly - 3 sets - 10 reps    ADDED 08/02/22: PRONE knee flexion, bridging  ASSESSMENT:   CLINICAL IMPRESSION: Patient overall doing very well with minimal to no pain overall.  He wishes to continue therapy he had had to cancel several visits due to work commitments.  He does not feel like he is where he needs to be however, his right knee ROM is limited from surgery 10 years ago. Strength in quads and hamstrings.  We touched on hip strength and hinging today to maximize effectiveness lifting,   Body mechanics.  Next week we will reassess a plan and likely extend for another 6-8 visits with the intention of beginning at the gym and making daily exercise a habit.   OBJECTIVE IMPAIRMENTS Abnormal gait, decreased activity tolerance, decreased endurance, decreased  mobility, difficulty walking, decreased ROM, decreased strength, hypomobility, increased edema, and pain.    ACTIVITY LIMITATIONS carrying, lifting, bending, standing, squatting, transfers, and dressing   PARTICIPATION LIMITATIONS: driving, community activity, and occupation   PERSONAL FACTORS  None  are also affecting patient's functional outcome.    REHAB POTENTIAL: Good   CLINICAL DECISION MAKING: Stable/uncomplicated   EVALUATION COMPLEXITY: Low     GOALS: Goals reviewed with patient? Yes   SHORT TERM GOALS: Target date: 07/02/2022  Pt will demonstrate appropriate understanding and performance of initially prescribed HEP in order to facilitate improved independence with management of symptoms.  Baseline: HEP provided on eval 07/25/22: good performance, limited compliance d/t business Goal status: MET   2. Pt will score greater than or equal to 54 on FOTO in order to demonstrate improved perception of function due to symptoms.            Baseline: 49  07/25/22: 73%  Goal status: MET   LONG TERM GOALS: Target date: 07/30/2022    Pt will score 62 or greater on FOTO in order to demonstrate improved perception of function due to symptoms.  Baseline: 49 07/25/22: 73% Goal status: MET   2.  Pt will demonstrate at least 0-105 degrees of L knee AROM in order to facilitate improved tolerance to functional movements such as walking and lifting.  Baseline: -8 ext, 78 flexion 07/25/22: passive to 105 deg, AROM 95-98 deg.  09/06/22: AROM 103, PROM 106 and painful  Goal status: ONGOING   3.  Pt will be able to lift up to 10# in order to demonstrate improved capacity for daily activities such as lifting/carrying drapery for work activities.  Baseline: NT due to proximity to surgery 09/06/22: 10# 5 reps no difficulty Goal status: MET   4.  Pt will be able to perform 5xSTS in less than or equal to 11sec with appropriate mechanics in order to demonstrate reduced fall risk and  improved functional independence (MCID 5xSTS = 2.3 sec). Baseline: 14 sec, with significant anterior placement of LLE 09/06/22: 9 sec no UE support, standard chair   Goal status: MET     PLAN: PT FREQUENCY: 2x/week   PT DURATION: 8 weeks   PLANNED INTERVENTIONS: Therapeutic exercises, Therapeutic activity, Neuromuscular re-education, Balance training, Gait training, Patient/Family education, Self Care, Joint mobilization, Stair training, DME instructions, Dry Needling, Electrical stimulation, Cryotherapy, Moist heat, Vasopneumatic device, Manual therapy, and Re-evaluation   PLAN FOR NEXT SESSION:   RENEWS? Reasses. continue functional activity and knee flexion emphasis  Raeford Razor, PT 09/20/22 9:05 AM Phone: [phone] Fax: [phone]

## 2022-09-24 ENCOUNTER — Ambulatory Visit: Payer: Medicare Other | Attending: Orthopaedic Surgery | Admitting: Physical Therapy

## 2022-09-24 ENCOUNTER — Encounter: Payer: Self-pay | Admitting: Physical Therapy

## 2022-09-24 DIAGNOSIS — M25562 Pain in left knee: Secondary | ICD-10-CM | POA: Diagnosis not present

## 2022-09-24 DIAGNOSIS — R2689 Other abnormalities of gait and mobility: Secondary | ICD-10-CM | POA: Diagnosis present

## 2022-09-24 DIAGNOSIS — M25662 Stiffness of left knee, not elsewhere classified: Secondary | ICD-10-CM | POA: Diagnosis present

## 2022-09-24 DIAGNOSIS — R609 Edema, unspecified: Secondary | ICD-10-CM | POA: Insufficient documentation

## 2022-09-24 NOTE — Therapy (Signed)
OUTPATIENT PHYSICAL THERAPY TREATMENT NOTE  Patient Name: Levi Strickland MRN: [id number] DOB:December 27, 1955, 67 y.o., male Today's Date: 09/24/2022       PCP: none in chart   REFERRING PROVIDER: Gwenlyn Found MD  END OF SESSION:   PT End of Session - 09/24/22 1021     Visit Number 16    Number of Visits --    Date for PT Re-Evaluation 11/05/22    Authorization Type Medicare AB    PT Start Time 1018    PT Stop Time 1100    PT Time Calculation (min) 42 min    Activity Tolerance Patient tolerated treatment well;No increased pain    Behavior During Therapy [facility] for tasks assessed/performed                 History reviewed. No pertinent past medical history. Past Surgical History:  Procedure Laterality Date   JOINT REPLACEMENT     There are no problems to display for this patient.   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 I had to change my appt I have to go out of town tomorrow.   PAIN:  Are you having pain: stiffness, no pain  0/10 Location: medial L knee How would you describe your pain? Sharp, deep Aggravating factors: lifting/carrying, prolonged sitting/driving pending on positioning Easing factors: ice, elevated   OBJECTIVE: (objective measures completed at initial evaluation unless otherwise dated)   DIAGNOSTIC FINDINGS:  None in chart     PATIENT SURVEYS:  FOTO 49 FOTO 07/25/22: 73%  FOTO    COGNITION:           Overall cognitive status: Within functional limits for tasks assessed                          SENSATION: Does not endorse any sensory  deficits   EDEMA:  Generalized swelling L knee joint - figure 8 deferred as pt arrives with dressing on incision - appears clean and intact, no concerns.       PALPATION: Palpable tightness L quad/TFL and hip musculature   LOWER EXTREMITY ROM:   Active/Passive ROM Right eval Left eval Left 07/03/22 07/10/22 Left passive only 07/25/22 L AROM/PROM 08/21/22  PROM  09/06/22 L AROM/PROM 09/24/22 AROM  Knee flexion (Addended 105 deg)  78/85 95/96 98 94/100 105 best  103/105p! AAROM  105 deg   Knee extension 0 -8/-5 0/0 mild subjective tightness  WNL   0   (Blank rows = not tested)   Comments: Performed passive physiological movement flex/extension of L knee with gentle oscillations to reduce muscle guarding, pt reports good relief       LOWER EXTREMITY MMT:     MMT Right eval Left eval Lt 08/02/22 Left 09/06/22  Hip flexion _0 Hip abduction (modified sitting) 5 5 4+ 5  Knee flexion     5 5  Knee extension     5 5   (Blank rows = not tested)   Comments: strain with L hip flexion, no pain. Knee MMT deferred given proximity to surgery     FUNCTIONAL TESTS:  5 times sit to stand: 14 sec STS   - standard chair, no UE support. Significant anterior positioning of LLE and weight shift to RLE noted 09/06/22: 5xSTS 9sec standard chair even BOS no UE support   GAIT: Distance walked: within clinic Assistive device utilized: Single point cane Level of assistance: Modified independence Comments: partial step through pattern, SPC in RUE, fwd flexed posture, reduced stance time and step length on LLE       TODAY'S TREATMENT:   [facility] Adult PT Treatment:                                                DATE: 09/24/22 Therapeutic Exercise: Recumbent bike L 2 for L knee ROM Knee ext 25 lbs x 15, 2 sets  Knee flex 35 lbs x 20 Step up 15 lbs FW and lateral , 8 inch step  Step up and over 11 inch used 1 UE due to poor control , balance x 15  Step up and over 8 inch using 1 UE x 15   Prone knee flexion AAROM Supine AAROM Sit to stand , stagger step to increase LLE weightbearing   [facility] Adult PT Treatment:                                                DATE: 09/20/22 Therapeutic Exercise: NuStep L7 LE only for 6 min  Knee ROM: extension and flexion using strap to assist  Hamstring and ITB, adductor stretching  Knee extension banded blue x 45 sec Seated TB knee ext and flexion x 45 sec each  Isometric wall sit 25 lbs x 45 sec Added variations to ankle to increase quad work 25 lbs KB  Wall squat 25 lbs KB full range x 15   B stance lunge with and without wgt Dead lift 25 lbs mod cues for form  Manual Therapy: PROM L knee flexion , Gr I-III mobilization knee flexion  Supine and seated to tolerance       PATIENT EDUCATION:  Education details: HEP performance, importance of adherence, rationale for interventions throughout, AROM and progress. PT POC Person educated: Patient Education method: Explanation, Demonstration, Tactile cues, Verbal cues Education comprehension: verbalized understanding, returned demonstration, verbal cues required, tactile cues required, and needs further education      HOME EXERCISE PROGRAM: Access Code: [email or web address] URL: [email or web address] Date: 06/04/2022 Prepared by: Enis Slipper   Exercises - Supine Quad Set  - 1 x daily - 7 x weekly - 3 sets - 10 reps - Seated Long Arc Quad  - 1 x daily - 7 x weekly - 3 sets - 10 reps - Seated Knee Flexion Extension AROM (seated heel slide with strap)  - 1 x daily - 7 x weekly - 3 sets - 10 reps     -ADDED 08/02/22: PRONE knee flexion, bridging   ASSESSMENT:   CLINICAL IMPRESSION: Patient continues to be limited in L LE ROM and functional mobility.  He has met most of his goals but would like to be able to bend knee with more ease and control when transferring, performing ADLs and lifting.  He will be renewed for 4-6 more weeks with the intention of joining a gym and  developing  habits to support a healthy lifestyle. AROM of Lt knee is 0-105.  Of note, his Rt knee is limited as well from previous knee surgery.  He expects to be able to come 1-2 times per week during this transition     OBJECTIVE IMPAIRMENTS Abnormal gait, decreased activity tolerance, decreased endurance, decreased mobility, difficulty walking, decreased ROM, decreased strength, hypomobility, increased edema, and pain.    ACTIVITY LIMITATIONS carrying, lifting, bending, standing, squatting, transfers, and dressing   PARTICIPATION LIMITATIONS: driving, community activity, and occupation   PERSONAL FACTORS  None  are also affecting patient's functional outcome.    REHAB POTENTIAL: Good   CLINICAL DECISION MAKING: Stable/uncomplicated   EVALUATION COMPLEXITY: Low     GOALS: Goals reviewed with patient? Yes   SHORT TERM GOALS: Target date: 07/02/2022  Pt will demonstrate appropriate understanding and performance of initially prescribed HEP in order to facilitate improved independence with management of symptoms.  Baseline: HEP provided on eval 07/25/22: good performance, limited compliance d/t business Goal status: MET   2. Pt will score greater than or equal to 54 on FOTO in order to demonstrate improved perception of function due to symptoms.            Baseline: 49  07/25/22: 73%            Goal status: MET   LONG TERM GOALS: Target date: 07/30/2022    Pt will score 62 or greater on FOTO in order to demonstrate improved perception of function due to symptoms.  Baseline: 49 07/25/22: 73% Goal status: MET   2.  Pt will demonstrate at least 0-105 degrees of L knee AROM in order to facilitate improved tolerance to functional movements such as walking and lifting.  Baseline: -8 ext, 78 flexion 07/25/22: passive to 105 deg, AROM 95-98 deg.  09/06/22: AROM 103, PROM 106 and painful  Goal status: ONGOING   3.  Pt will be able to lift up to 10# in order to demonstrate improved  capacity for daily activities such as lifting/carrying drapery for work activities.  Baseline: NT due to proximity to surgery 09/06/22: 10# 5 reps no difficulty Goal status: MET   4.  Pt will be able to perform 5xSTS in less than or equal to 11sec with appropriate mechanics in order to demonstrate reduced fall risk and improved functional independence (MCID 5xSTS = 2.3 sec). Baseline: 14 sec, with significant anterior placement of LLE 09/06/22: 9 sec no UE support, standard chair   Goal status: MET     5.  Patient will get into a habit of daily stretching to maximize outcomes    Baseline: inconsistent     Goal status: NEW    PLAN: PT FREQUENCY:1- 2x/week   PT DURATION: 4-6 weeks    PLANNED INTERVENTIONS: Therapeutic exercises, Therapeutic activity, Neuromuscular re-education, Balance training, Gait training, Patient/Family education, Self Care, Joint mobilization, Stair training, DME instructions, Dry Needling, Electrical stimulation, Cryotherapy, Moist heat, Vasopneumatic device, Manual therapy, and Re-evaluation   PLAN FOR NEXT SESSION:    continue functional activity and knee flexion emphasis  Raeford Razor, PT   09/24/22 11:19 AM Phone: [phone] Fax: [phone]

## 2022-09-25 ENCOUNTER — Ambulatory Visit: Payer: Medicare Other | Admitting: Physical Therapy

## 2022-10-01 NOTE — Therapy (Incomplete)
OUTPATIENT PHYSICAL THERAPY TREATMENT NOTE  Patient Name: Levi Strickland MRN: [id number] DOB:08-28-1956, 67 y.o., male Today's Date: 10/01/2022       PCP: none in chart   REFERRING PROVIDER: Bethann Berkshire MD  END OF SESSION:         No past medical history on file. Past Surgical History:  Procedure Laterality Date   JOINT REPLACEMENT     There are no problems to display for this patient.   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  Rationale for Evaluation and Treatment Rehabilitation  PERTINENT HISTORY: Previous MSK surgeries  PRECAUTIONS: s/p TKA 05/22/22  SUBJECTIVE:   ***  *** No pain, min stiffness today. I had to change my appt I have to go out of town tomorrow.   PAIN:  Are you having pain: stiffness, no pain  0/10 Location: medial L knee How would you describe your pain? Sharp, deep Aggravating factors: lifting/carrying, prolonged sitting/driving pending on positioning Easing factors: ice, elevated   OBJECTIVE: (objective measures completed at initial evaluation unless otherwise dated)   DIAGNOSTIC FINDINGS:  None in chart     PATIENT SURVEYS:  FOTO 85 FOTO 07/25/22: 73%  FOTO    COGNITION:           Overall cognitive status: Within functional limits for tasks assessed                          SENSATION: Does not endorse any sensory deficits   EDEMA:  Generalized swelling L knee joint - figure 8 deferred as pt arrives with dressing on incision - appears clean and intact, no concerns.       PALPATION: Palpable tightness L quad/TFL and hip musculature   LOWER EXTREMITY ROM:   Active/Passive ROM Right eval Left eval Left 07/03/22 07/10/22 Left passive only 07/25/22 L AROM/PROM 08/21/22  PROM  09/06/22 L AROM/PROM 09/24/22 AROM  Knee flexion (Addended 105 deg)  78/85 95/96 98 94/100 105 best  103/105p! AAROM  105 deg   Knee extension 0 -8/-5 0/0 mild  subjective tightness  WNL   0   (Blank rows = not tested)   Comments: Performed passive physiological movement flex/extension of L knee with gentle oscillations to reduce muscle guarding, pt reports good relief       LOWER EXTREMITY MMT:     MMT Right eval Left eval Lt 08/02/22 Left 09/06/22  Hip flexion 4 4 5 5   Hip abduction (modified sitting) 5 5 4+ 5  Knee flexion     5 5  Knee extension     5 5   (Blank rows = not tested)   Comments: strain with L hip flexion, no pain. Knee MMT deferred given proximity to surgery     FUNCTIONAL TESTS:  5 times sit to stand: 14 sec STS   - standard chair, no UE support. Significant anterior positioning of LLE and weight shift to RLE noted 09/06/22: 5xSTS 9sec standard chair even BOS no UE support   GAIT: Distance walked: within clinic Assistive device utilized: Single point cane Level of assistance: Modified independence Comments: partial step through pattern, SPC in RUE, fwd flexed posture, reduced stance time and step length on LLE       TODAY'S TREATMENT: [facility] Adult PT Treatment:  DATE: 10/02/22 Therapeutic Exercise: *** Manual Therapy: *** Neuromuscular re-ed: *** Therapeutic Activity: *** Modalities: *** Self Care: ***   [facility] Adult PT Treatment:                                                DATE: 09/24/22 Therapeutic Exercise: Recumbent bike L 2 for L knee ROM Knee ext 25 lbs x 15, 2 sets  Knee flex 35 lbs x 20 Step up 15 lbs FW and lateral , 8 inch step  Step up and over 11 inch used 1 UE due to poor control , balance x 15  Step up and over 8 inch using 1 UE x 15  Prone knee flexion AAROM Supine AAROM Sit to stand , stagger step to increase LLE weightbearing   [facility] Adult PT Treatment:                                                DATE: 09/20/22 Therapeutic Exercise: NuStep L7 LE only for 6 min  Knee ROM: extension and flexion using strap to assist  Hamstring and  ITB, adductor stretching  Knee extension banded blue x 45 sec Seated TB knee ext and flexion x 45 sec each  Isometric wall sit 25 lbs x 45 sec Added variations to ankle to increase quad work 25 lbs KB  Wall squat 25 lbs KB full range x 15   B stance lunge with and without wgt Dead lift 25 lbs mod cues for form  Manual Therapy: PROM L knee flexion , Gr I-III mobilization knee flexion  Supine and seated to tolerance       PATIENT EDUCATION:  Education details: HEP performance, importance of adherence, rationale for interventions throughout, AROM and progress. PT POC Person educated: Patient Education method: Explanation, Demonstration, Tactile cues, Verbal cues Education comprehension: verbalized understanding, returned demonstration, verbal cues required, tactile cues required, and needs further education      HOME EXERCISE PROGRAM: Access Code: [email or web address] URL: [email or web address] Date: 06/04/2022 Prepared by: Levi Strickland   Exercises - Supine Quad Set  - 1 x daily - 7 x weekly - 3 sets - 10 reps - Seated Long Arc Quad  - 1 x daily - 7 x weekly - 3 sets - 10 reps - Seated Knee Flexion Extension AROM (seated heel slide with strap)  - 1 x daily - 7 x weekly - 3 sets - 10 reps     -ADDED 08/02/22: PRONE knee flexion, bridging   ASSESSMENT:   CLINICAL IMPRESSION: ***  *** Patient continues to be limited in L LE ROM and functional mobility.  He has met most of his goals but would like to be able to bend knee with more ease and control when transferring, performing ADLs and lifting.  He will be renewed for 4-6 more weeks with the intention of joining a gym and developing habits to support a healthy lifestyle. AROM of Lt knee is 0-105.  Of note, his Rt knee is limited as well from previous knee surgery.  He expects to be able to come 1-2 times per week during this transition     OBJECTIVE IMPAIRMENTS Abnormal gait, decreased activity tolerance, decreased endurance,  decreased mobility, difficulty walking, decreased ROM,  decreased strength, hypomobility, increased edema, and pain.    ACTIVITY LIMITATIONS carrying, lifting, bending, standing, squatting, transfers, and dressing   PARTICIPATION LIMITATIONS: driving, community activity, and occupation   PERSONAL FACTORS  None  are also affecting patient's functional outcome.    REHAB POTENTIAL: Good   CLINICAL DECISION MAKING: Stable/uncomplicated   EVALUATION COMPLEXITY: Low     GOALS: Goals reviewed with patient? Yes   SHORT TERM GOALS: Target date: 07/02/2022  Pt will demonstrate appropriate understanding and performance of initially prescribed HEP in order to facilitate improved independence with management of symptoms.  Baseline: HEP provided on eval 07/25/22: good performance, limited compliance d/t business Goal status: MET   2. Pt will score greater than or equal to 54 on FOTO in order to demonstrate improved perception of function due to symptoms.            Baseline: 49  07/25/22: 73%            Goal status: MET   LONG TERM GOALS: Target date: 07/30/2022    Pt will score 62 or greater on FOTO in order to demonstrate improved perception of function due to symptoms.  Baseline: 49 07/25/22: 73% Goal status: MET   2.  Pt will demonstrate at least 0-105 degrees of L knee AROM in order to facilitate improved tolerance to functional movements such as walking and lifting.  Baseline: -8 ext, 78 flexion 07/25/22: passive to 105 deg, AROM 95-98 deg.  09/06/22: AROM 103, PROM 106 and painful  Goal status: ONGOING   3.  Pt will be able to lift up to 10# in order to demonstrate improved capacity for daily activities such as lifting/carrying drapery for work activities.  Baseline: NT due to proximity to surgery 09/06/22: 10# 5 reps no difficulty Goal status: MET   4.  Pt will be able to perform 5xSTS in less than or equal to 11sec with appropriate mechanics in order to demonstrate reduced fall  risk and improved functional independence (MCID 5xSTS = 2.3 sec). Baseline: 14 sec, with significant anterior placement of LLE 09/06/22: 9 sec no UE support, standard chair   Goal status: MET     5.  Patient will get into a habit of daily stretching to maximize outcomes    Baseline: inconsistent     Goal status: NEW    PLAN: PT FREQUENCY:1- 2x/week   PT DURATION: 4-6 weeks    PLANNED INTERVENTIONS: Therapeutic exercises, Therapeutic activity, Neuromuscular re-education, Balance training, Gait training, Patient/Family education, Self Care, Joint mobilization, Stair training, DME instructions, Dry Needling, Electrical stimulation, Cryotherapy, Moist heat, Vasopneumatic device, Manual therapy, and Re-evaluation   PLAN FOR NEXT SESSION:    continue functional activity and knee flexion emphasis  Leeroy Cha PT, DPT 10/01/2022 9:11 AM

## 2022-10-02 ENCOUNTER — Ambulatory Visit: Payer: Medicare Other | Admitting: Physical Therapy

## 2022-10-02 NOTE — Therapy (Signed)
OUTPATIENT PHYSICAL THERAPY TREATMENT NOTE  Patient Name: Levi Strickland MRN: [id number] DOB:1955/12/17, 67 y.o., male Today's Date: 10/03/2022       PCP: none in chart   REFERRING PROVIDER: Bethann Berkshire MD  END OF SESSION:   PT End of Session - 10/03/22 0847     Visit Number [id number]    Date for PT Re-Evaluation 11/05/22    Authorization Type Medicare [employer]    PT Start Time 0847    PT Stop Time 0926    PT Time Calculation (min) 39 min    Activity Tolerance Patient tolerated treatment well;No increased pain    Behavior During Therapy [facility] for tasks assessed/performed                  History reviewed. No pertinent past medical history. Past Surgical History:  Procedure Laterality Date   JOINT REPLACEMENT     There are no problems to display for this patient.   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   PAIN:  Are you having pain: stiffness, no pain  0/10 Location: medial L knee How would you describe your pain? Sharp, deep Aggravating factors: lifting/carrying, prolonged sitting/driving pending on positioning Easing factors: ice, elevated   OBJECTIVE: (objective measures completed at initial evaluation unless otherwise dated)   DIAGNOSTIC FINDINGS:  None in chart     PATIENT SURVEYS:  FOTO 51 FOTO 07/25/22: 73%  FOTO    COGNITION:           Overall cognitive status: Within functional limits for tasks assessed                          SENSATION: Does not endorse any sensory deficits   EDEMA:   Generalized swelling L knee joint - figure 8 deferred as pt arrives with dressing on incision - appears clean and intact, no concerns.       PALPATION: Palpable tightness L quad/TFL and hip musculature   LOWER EXTREMITY ROM:   Active/Passive ROM Right eval Left eval Left 07/03/22 07/10/22 Left passive only 07/25/22 L AROM/PROM 08/21/22  PROM  09/06/22 L AROM/PROM 09/24/22 AROM  Knee flexion (Addended 105 deg)  78/85 95/96 98 94/100 105 best  103/105p! AAROM  105 deg   Knee extension 0 -8/-5 0/0 mild subjective tightness  WNL   0   (Blank rows = not tested)   Comments: Performed passive physiological movement flex/extension of L knee with gentle oscillations to reduce muscle guarding, pt reports good relief       LOWER EXTREMITY MMT:     MMT Right eval Left eval Lt 08/02/22 Left 09/06/22  Hip flexion 4 4 5 5   Hip abduction (modified sitting) 5 5 4+ 5  Knee flexion     5 5  Knee extension     5 5   (Blank rows = not tested)   Comments: strain with L hip flexion, no pain. Knee MMT deferred given proximity to surgery     FUNCTIONAL TESTS:  5 times sit to stand: 14 sec STS   -  standard chair, no UE support. Significant anterior positioning of LLE and weight shift to RLE noted 09/06/22: 5xSTS 9sec standard chair even BOS no UE support   GAIT: Distance walked: within clinic Assistive device utilized: Single point cane Level of assistance: Modified independence Comments: partial step through pattern, SPC in RUE, fwd flexed posture, reduced stance time and step length on LLE       TODAY'S TREATMENT: [facility] Adult PT Treatment:                                                DATE: 10/03/22 Therapeutic Exercise: Recumbent bike warm up SL knee ext machine 15# 3x8, cues for foot position and rest breaks SL knee flex machine 25#, 3x8 cues for full ROM and control Prone therapist resisted hamstring curls throughout full range, isometric hold at end range 2x5  Prone knee  flexion stretch w/ strap 3x30sec cues for breath control Step over 6inch no UE support 2x15 cues for form and control BW squat to parallel x15, x8 with mirror for symmetrical WB. PT assist to maintain symmetry at pelvis    [facility] Adult PT Treatment:                                                DATE: 09/24/22 Therapeutic Exercise: Recumbent bike L 2 for L knee ROM Knee ext 25 lbs x 15, 2 sets  Knee flex 35 lbs x 20 Step up 15 lbs FW and lateral , 8 inch step  Step up and over 11 inch used 1 UE due to poor control , balance x 15  Step up and over 8 inch using 1 UE x 15  Prone knee flexion AAROM Supine AAROM Sit to stand , stagger step to increase LLE weightbearing   [facility] Adult PT Treatment:                                                DATE: 09/20/22 Therapeutic Exercise: NuStep L7 LE only for 6 min  Knee ROM: extension and flexion using strap to assist  Hamstring and ITB, adductor stretching  Knee extension banded blue x 45 sec Seated TB knee ext and flexion x 45 sec each  Isometric wall sit 25 lbs x 45 sec Added variations to ankle to increase quad work 25 lbs KB  Wall squat 25 lbs KB full range x 15   B stance lunge with and without wgt Dead lift 25 lbs mod cues for form  Manual Therapy: PROM L knee flexion , Gr I-III mobilization knee flexion  Supine and seated to tolerance       PATIENT EDUCATION:  Education details: HEP performance, importance of adherence, rationale for interventions throughout, AROM and progress. PT POC Person educated: Patient Education method: Explanation, Demonstration, Tactile cues, Verbal cues Education comprehension: verbalized understanding, returned demonstration, verbal cues required, tactile cues required, and needs further education      HOME EXERCISE PROGRAM: Access Code: [email or web address] URL: [email or web address] Date: 06/04/2022 Prepared by: Fransisco Hertz   Exercises - Supine Quad Set  - 1 x daily -  7 x weekly - 3 sets -  10 reps - Seated Long Arc Quad  - 1 x daily - 7 x weekly - 3 sets - 10 reps - Seated Knee Flexion Extension AROM (seated heel slide with strap)  - 1 x daily - 7 x weekly - 3 sets - 10 reps     -ADDED 08/02/22: PRONE knee flexion, bridging   ASSESSMENT:   CLINICAL IMPRESSION: Pt arrives w/o pain, states primary complaint is stiffness. Denies any issues with functional mobility except for stiffness and notes that this improves w/ current HEP. Today's session focusing on end range flexion and strengthening throughout full ROM, pt tolerates session well without increase in resting pain. With BW squats pt demonstrates significant hip shift towards R at end range flexion which improves w/ PT assist weaned with repetition. No adverse events, pt reports resolution of stiffness on departure without pain. Pt departs today's session in no acute distress, all voiced questions/concerns addressed appropriately from PT perspective.      OBJECTIVE IMPAIRMENTS Abnormal gait, decreased activity tolerance, decreased endurance, decreased mobility, difficulty walking, decreased ROM, decreased strength, hypomobility, increased edema, and pain.    ACTIVITY LIMITATIONS carrying, lifting, bending, standing, squatting, transfers, and dressing   PARTICIPATION LIMITATIONS: driving, community activity, and occupation   PERSONAL FACTORS  None  are also affecting patient's functional outcome.    REHAB POTENTIAL: Good   CLINICAL DECISION MAKING: Stable/uncomplicated   EVALUATION COMPLEXITY: Low     GOALS: Goals reviewed with patient? Yes   SHORT TERM GOALS: Target date: 07/02/2022  Pt will demonstrate appropriate understanding and performance of initially prescribed HEP in order to facilitate improved independence with management of symptoms.  Baseline: HEP provided on eval 07/25/22: good performance, limited compliance d/t business Goal status: MET   2. Pt will score greater than or equal to 54 on FOTO in order  to demonstrate improved perception of function due to symptoms.            Baseline: 49  07/25/22: 73%            Goal status: MET   LONG TERM GOALS: Target date: 07/30/2022    Pt will score 62 or greater on FOTO in order to demonstrate improved perception of function due to symptoms.  Baseline: 49 07/25/22: 73% Goal status: MET   2.  Pt will demonstrate at least 0-105 degrees of L knee AROM in order to facilitate improved tolerance to functional movements such as walking and lifting.  Baseline: -8 ext, 78 flexion 07/25/22: passive to 105 deg, AROM 95-98 deg.  09/06/22: AROM 103, PROM 106 and painful  Goal status: ONGOING   3.  Pt will be able to lift up to 10# in order to demonstrate improved capacity for daily activities such as lifting/carrying drapery for work activities.  Baseline: NT due to proximity to surgery 09/06/22: 10# 5 reps no difficulty Goal status: MET   4.  Pt will be able to perform 5xSTS in less than or equal to 11sec with appropriate mechanics in order to demonstrate reduced fall risk and improved functional independence (MCID 5xSTS = 2.3 sec). Baseline: 14 sec, with significant anterior placement of LLE 09/06/22: 9 sec no UE support, standard chair   Goal status: MET     5.  Patient will get into a habit of daily stretching to maximize outcomes    Baseline: inconsistent     Goal status: NEW    PLAN: PT FREQUENCY:1- 2x/week   PT DURATION:  4-6 weeks    PLANNED INTERVENTIONS: Therapeutic exercises, Therapeutic activity, Neuromuscular re-education, Balance training, Gait training, Patient/Family education, Self Care, Joint mobilization, Stair training, DME instructions, Dry Needling, Electrical stimulation, Cryotherapy, Moist heat, Vasopneumatic device, Manual therapy, and Re-evaluation   PLAN FOR NEXT SESSION:    continue functional activity and knee flexion emphasis  Ashley Murrain PT, DPT 10/03/2022 9:29 AM

## 2022-10-03 ENCOUNTER — Encounter: Payer: Self-pay | Admitting: Physical Therapy

## 2022-10-03 ENCOUNTER — Ambulatory Visit: Payer: Medicare Other | Admitting: Physical Therapy

## 2022-10-03 DIAGNOSIS — M25562 Pain in left knee: Secondary | ICD-10-CM

## 2022-10-03 DIAGNOSIS — R609 Edema, unspecified: Secondary | ICD-10-CM

## 2022-10-03 DIAGNOSIS — R2689 Other abnormalities of gait and mobility: Secondary | ICD-10-CM

## 2022-10-03 DIAGNOSIS — M25662 Stiffness of left knee, not elsewhere classified: Secondary | ICD-10-CM

## 2022-10-04 ENCOUNTER — Encounter: Payer: Medicare Other | Admitting: Physical Therapy

## 2022-10-09 ENCOUNTER — Ambulatory Visit: Payer: Medicare Other | Admitting: Physical Therapy

## 2022-10-09 NOTE — Therapy (Deleted)
OUTPATIENT PHYSICAL THERAPY TREATMENT NOTE  Patient Name: Levi Strickland MRN: [id number] DOB:1955/12/03, 67 y.o., male Today's Date: 10/09/2022       PCP: none in chart   REFERRING PROVIDER: Gwenlyn Found MD  END OF SESSION:          No past medical history on file. Past Surgical History:  Procedure Laterality Date   JOINT REPLACEMENT     There are no problems to display for this patient.   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  Rationale for Evaluation and Treatment Rehabilitation  PERTINENT HISTORY: Previous MSK surgeries  PRECAUTIONS: s/p TKA 05/22/22  SUBJECTIVE:   Pt denies any pain except for with end range flexion, primary complaint is stiffness.   PAIN:  Are you having pain: stiffness, no pain  0/10 Location: medial L knee How would you describe your pain? Sharp, deep Aggravating factors: lifting/carrying, prolonged sitting/driving pending on positioning Easing factors: ice, elevated   OBJECTIVE: (objective measures completed at initial evaluation unless otherwise dated)   DIAGNOSTIC FINDINGS:  None in chart     PATIENT SURVEYS:  FOTO 69 FOTO 07/25/22: 73%  FOTO    COGNITION:           Overall cognitive status: Within functional limits for tasks assessed                          SENSATION: Does not endorse any sensory deficits   EDEMA:  Generalized swelling L knee joint - figure 8 deferred as pt arrives with dressing on incision - appears clean and intact, no concerns.       PALPATION: Palpable tightness L quad/TFL and hip musculature   LOWER EXTREMITY ROM:   Active/Passive ROM Right eval Left eval Left 07/03/22 07/10/22 Left passive only 07/25/22 L AROM/PROM 08/21/22  PROM  09/06/22 L AROM/PROM 09/24/22 AROM  Knee flexion (Addended 105 deg)  78/85 95/96 98 94/100 105 best  103/105p! AAROM  105 deg   Knee extension 0 -8/-5 0/0 mild subjective  tightness  WNL   0   (Blank rows = not tested)   Comments: Performed passive physiological movement flex/extension of L knee with gentle oscillations to reduce muscle guarding, pt reports good relief       LOWER EXTREMITY MMT:     MMT Right eval Left eval Lt 08/02/22 Left 09/06/22  Hip flexion 4 4 5 5  $ Hip abduction (modified sitting) 5 5 4+ 5  Knee flexion     5 5  Knee extension     5 5   (Blank rows = not tested)   Comments: strain with L hip flexion, no pain. Knee MMT deferred given proximity to surgery     FUNCTIONAL TESTS:  5 times sit to stand: 14 sec STS   - standard chair, no UE support. Significant anterior positioning of LLE and weight shift to RLE noted 09/06/22: 5xSTS 9sec standard chair even BOS no UE support   GAIT: Distance walked: within clinic Assistive device utilized: Single point cane Level of assistance: Modified independence Comments: partial step through pattern, SPC in RUE, fwd flexed posture, reduced stance time and step length on LLE       TODAY'S TREATMENT:  [facility] Adult PT Treatment:  DATE: 10/09/22 Therapeutic Exercise: *** Manual Therapy: *** Neuromuscular re-ed: *** Therapeutic Activity: *** Modalities: *** Self Care: ***  [name] Adult PT Treatment:                                                DATE: 10/03/22 Therapeutic Exercise: Recumbent bike 78mn warm up SL knee ext machine 15# 3x8, cues for foot position and rest breaks SL knee flex machine 25#, 3x8 cues for full ROM and control Prone therapist resisted hamstring curls throughout full range, isometric hold at end range 2x5  Prone knee flexion stretch w/ strap 3x30sec cues for breath control Step over 6inch no UE support 2x15 cues for form and control BW squat to parallel x15, x8 with mirror for symmetrical WB. PT assist to maintain symmetry at pelvis    [facility]                                                DATE:  09/24/22 Therapeutic Exercise: Recumbent bike L 2 for L knee ROM Knee ext 25 lbs x 15, 2 sets  Knee flex 35 lbs x 20 Step up 15 lbs FW and lateral , 8 inch step  Step up and over 11 inch used 1 UE due to poor control , balance x 15  Step up and over 8 inch using 1 UE x 15  Prone knee flexion AAROM Supine AAROM Sit to stand , stagger step to increase LLE weightbearing   [facility] Adult PT Treatment:                                                DATE: 09/20/22 Therapeutic Exercise: NuStep L7 LE only for 6 min  Knee ROM: extension and flexion using strap to assist  Hamstring and ITB, adductor stretching  Knee extension banded blue x 45 sec Seated TB knee ext and flexion x 45 sec each  Isometric wall sit 25 lbs x 45 sec Added variations to ankle to increase quad work 25 lbs KB  Wall squat 25 lbs KB full range x 15   B stance lunge with and without wgt Dead lift 25 lbs mod cues for form  Manual Therapy: PROM L knee flexion , Gr I-III mobilization knee flexion  Supine and seated to tolerance       PATIENT EDUCATION:  Education details: HEP performance, importance of adherence, rationale for interventions throughout, AROM and progress. PT POC Person educated: Patient Education method: Explanation, Demonstration, Tactile cues, Verbal cues Education comprehension: verbalized understanding, returned demonstration, verbal cues required, tactile cues required, and needs further education      HOME EXERCISE PROGRAM: Access Code: [email or web address]: [email or web address] Date: 06/04/2022 Prepared by: DEnis Slipper  Exercises - Supine Quad Set  - 1 x daily - 7 x weekly - 3 sets - 10 reps - Seated Long Arc Quad  - 1 x daily - 7 x weekly - 3 sets - 10 reps - Seated Knee Flexion Extension AROM (seated heel slide with strap)  - 1 x daily - 7 x weekly - 3  sets - 10 reps     -ADDED 08/02/22: PRONE knee flexion, bridging   ASSESSMENT:   CLINICAL IMPRESSION: Pt arrives w/o pain,  states primary complaint is stiffness. Denies any issues with functional mobility except for stiffness and notes that this improves w/ current HEP. Today's session focusing on end range flexion and strengthening throughout full ROM, pt tolerates session well without increase in resting pain. With BW squats pt demonstrates significant hip shift towards R at end range flexion which improves w/ PT assist weaned with repetition. No adverse events, pt reports resolution of stiffness on departure without pain. Pt departs today's session in no acute distress, all voiced questions/concerns addressed appropriately from PT perspective.      OBJECTIVE IMPAIRMENTS Abnormal gait, decreased activity tolerance, decreased endurance, decreased mobility, difficulty walking, decreased ROM, decreased strength, hypomobility, increased edema, and pain.    ACTIVITY LIMITATIONS carrying, lifting, bending, standing, squatting, transfers, and dressing   PARTICIPATION LIMITATIONS: driving, community activity, and occupation   PERSONAL FACTORS  None  are also affecting patient's functional outcome.    REHAB POTENTIAL: Good   CLINICAL DECISION MAKING: Stable/uncomplicated   EVALUATION COMPLEXITY: Low     GOALS: Goals reviewed with patient? Yes   SHORT TERM GOALS: Target date: 07/02/2022  Pt will demonstrate appropriate understanding and performance of initially prescribed HEP in order to facilitate improved independence with management of symptoms.  Baseline: HEP provided on eval 07/25/22: good performance, limited compliance d/t business Goal status: MET   2. Pt will score greater than or equal to 54 on FOTO in order to demonstrate improved perception of function due to symptoms.            Baseline: 49  07/25/22: 73%            Goal status: MET   LONG TERM GOALS: Target date: 07/30/2022    Pt will score 62 or greater on FOTO in order to demonstrate improved perception of function due to symptoms.  Baseline:  49 07/25/22: 73% Goal status: MET   2.  Pt will demonstrate at least 0-105 degrees of L knee AROM in order to facilitate improved tolerance to functional movements such as walking and lifting.  Baseline: -8 ext, 78 flexion 07/25/22: passive to 105 deg, AROM 95-98 deg.  09/06/22: AROM 103, PROM 106 and painful  Goal status: ONGOING   3.  Pt will be able to lift up to 10# in order to demonstrate improved capacity for daily activities such as lifting/carrying drapery for work activities.  Baseline: NT due to proximity to surgery 09/06/22: 10# 5 reps no difficulty Goal status: MET   4.  Pt will be able to perform 5xSTS in less than or equal to 11sec with appropriate mechanics in order to demonstrate reduced fall risk and improved functional independence (MCID 5xSTS = 2.3 sec). Baseline: 14 sec, with significant anterior placement of LLE 09/06/22: 9 sec no UE support, standard chair   Goal status: MET     5.  Patient will get into a habit of daily stretching to maximize outcomes    Baseline: inconsistent     Goal status: NEW    PLAN: PT FREQUENCY:1- 2x/week   PT DURATION: 4-6 weeks    PLANNED INTERVENTIONS: Therapeutic exercises, Therapeutic activity, Neuromuscular re-education, Balance training, Gait training, Patient/Family education, Self Care, Joint mobilization, Stair training, DME instructions, Dry Needling, Electrical stimulation, Cryotherapy, Moist heat, Vasopneumatic device, Manual therapy, and Re-evaluation   PLAN FOR NEXT SESSION:    continue functional  activity and knee flexion emphasis  Leeroy Cha PT, DPT 10/09/2022 8:02 AM

## 2022-10-11 ENCOUNTER — Ambulatory Visit: Payer: Medicare Other | Admitting: Physical Therapy

## 2022-10-15 NOTE — Therapy (Addendum)
OUTPATIENT PHYSICAL THERAPY TREATMENT NOTE + NO VISIT DISCHARGE  Patient Name: Levi Strickland MRN: [id number] DOB:10/12/1955, 67 y.o., male Today's Date: 10/16/2022   PCP: none in chart   REFERRING PROVIDER: Gwenlyn Found MD  END OF SESSION:   PT End of Session - 10/16/22 0936     Visit Number 18    Date for PT Re-Evaluation 11/05/22    Authorization Type Medicare [employer]    Progress Note Due on Visit 20    PT Start Time 0933    PT Stop Time 1012    PT Time Calculation (min) 39 min    Activity Tolerance Patient tolerated treatment well;No increased pain    Behavior During Therapy [facility] for tasks assessed/performed                   History reviewed. No pertinent past medical history. Past Surgical History:  Procedure Laterality Date   JOINT REPLACEMENT     There are no problems to display for this patient.   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 States he feels work stressors have been contributing to difficulty w/ attendance and HEP adherence which is limiting his progress.    PAIN:  Are you having pain: stiffness, no pain  0/10 Location: medial L knee How would you describe your pain? Sharp, deep Aggravating factors: lifting/carrying, prolonged sitting/driving pending on positioning Easing factors: ice, elevated   OBJECTIVE: (objective measures completed at initial evaluation unless otherwise dated)   DIAGNOSTIC FINDINGS:  None in chart     PATIENT SURVEYS:  FOTO 32 FOTO 07/25/22: 73%  FOTO     COGNITION:           Overall cognitive status: Within functional limits for tasks assessed                          SENSATION: Does not endorse any sensory deficits   EDEMA:  Generalized swelling L knee joint - figure 8 deferred as pt arrives with dressing on incision - appears clean and intact, no concerns.       PALPATION: Palpable tightness L quad/TFL and hip musculature   LOWER EXTREMITY ROM:   Active/Passive ROM Right eval Left eval Left 07/03/22 07/10/22 Left passive only 07/25/22 L AROM/PROM 08/21/22  PROM  09/06/22 L AROM/PROM 09/24/22 AROM  Knee flexion (Addended 105 deg)  78/85 95/96 98 94/100 105 best  103/105p! AAROM  105 deg   Knee extension 0 -8/-5 0/0 mild subjective tightness  WNL   0   (Blank rows = not tested)   Comments: Performed passive physiological movement flex/extension of L knee with gentle oscillations to reduce muscle guarding, pt reports good relief       LOWER EXTREMITY MMT:     MMT Right eval Left eval Lt 08/02/22 Left 09/06/22  Hip flexion 4 4 5 5   Hip abduction (modified sitting) 5 5 4+ 5  Knee flexion     5 5  Knee extension     5 5   (Blank rows = not tested)   Comments: strain  with L hip flexion, no pain. Knee MMT deferred given proximity to surgery     FUNCTIONAL TESTS:  5 times sit to stand: 14 sec STS   - standard chair, no UE support. Significant anterior positioning of LLE and weight shift to RLE noted 09/06/22: 5xSTS 9sec standard chair even BOS no UE support   GAIT: Distance walked: within clinic Assistive device utilized: Single point cane Level of assistance: Modified independence Comments: partial step through pattern, SPC in RUE, fwd flexed posture, reduced stance time and step length on LLE       TODAY'S TREATMENT: [facility] Adult PT Treatment:                                                DATE: 10/16/22 Therapeutic Exercise: Recumbent bike 47min during subjective SL knee flex 25# 4x8 cues for pacing and full  ROM SL knee ext 15# 4x8 cues for foot positioning Squat + UE support 2x8 below parallel, cues for symmetrical WB and hip positioning 6inch lateral step up 2x12 cues for reduced velocity and trunk posture PT resisted hamstring curls in prone 2x5 rest breaks as needed d/t cramping (resolves quickly) PT resisted knee extension in prone 3x5 HEP update + education   [facility] Adult PT Treatment:                                                DATE: 10/03/22 Therapeutic Exercise: Recumbent bike 75min warm up SL knee ext machine 15# 3x8, cues for foot position and rest breaks SL knee flex machine 25#, 3x8 cues for full ROM and control Prone therapist resisted hamstring curls throughout full range, isometric hold at end range 2x5  Prone knee flexion stretch w/ strap 3x30sec cues for breath control Step over 6inch no UE support 2x15 cues for form and control BW squat to parallel x15, x8 with mirror for symmetrical WB. PT assist to maintain symmetry at pelvis    [facility] Adult PT Treatment:                                                DATE: 09/24/22 Therapeutic Exercise: Recumbent bike L 2 for L knee ROM Knee ext 25 lbs x 15, 2 sets  Knee flex 35 lbs x 20 Step up 15 lbs FW and lateral , 8 inch step  Step up and over 11 inch used 1 UE due to poor control , balance x 15  Step up and over 8 inch using 1 UE x 15  Prone knee flexion AAROM Supine AAROM Sit to stand , stagger step to increase LLE weightbearing   [facility] Adult PT Treatment:                                                DATE: 09/20/22 Therapeutic Exercise: NuStep L7 LE only for 6 min  Knee ROM: extension and flexion using strap to assist  Hamstring and ITB, adductor stretching  Knee extension  banded blue x 45 sec Seated TB knee ext and flexion x 45 sec each  Isometric wall sit 25 lbs x 45 sec Added variations to ankle to increase quad work 25 lbs KB  Wall squat 25 lbs KB full range x 15   B stance lunge with and without wgt Dead lift 25  lbs mod cues for form  Manual Therapy: PROM L knee flexion , Gr I-III mobilization knee flexion  Supine and seated to tolerance       PATIENT EDUCATION:  Education details: HEP performance, importance of adherence, rationale for interventions throughout, AROM and progress. PT POC Person educated: Patient Education method: Explanation, Demonstration, Tactile cues, Verbal cues Education comprehension: verbalized understanding, returned demonstration, verbal cues required, tactile cues required, and needs further education      HOME EXERCISE PROGRAM: Access Code: [email or web address] URL: [email or web address] Date: 10/16/2022 Prepared by: Fransisco Hertz  Exercises - Sit to Stand  - 1 x daily - 7 x weekly - 2 sets - 8 reps - Standing Knee Flexion AROM with Chair Support  - 1 x daily - 7 x weekly - 2 sets - 8 reps  ASSESSMENT:   CLINICAL IMPRESSION: Pt arrives w/o pain, reports stiffness. Endorses difficulty w/ attendance and HEP due to work schedule. Today focusing primarily on increasing volume with familiar program, streamlining HEP for increased adherence. No adverse events, pt reports improved symptoms as session goes on. Pt departs today's session in no acute distress, all voiced questions/concerns addressed appropriately from PT perspective.    OBJECTIVE IMPAIRMENTS Abnormal gait, decreased activity tolerance, decreased endurance, decreased mobility, difficulty walking, decreased ROM, decreased strength, hypomobility, increased edema, and pain.    ACTIVITY LIMITATIONS carrying, lifting, bending, standing, squatting, transfers, and dressing   PARTICIPATION LIMITATIONS: driving, community activity, and occupation   PERSONAL FACTORS  None  are also affecting patient's functional outcome.    REHAB POTENTIAL: Good   CLINICAL DECISION MAKING: Stable/uncomplicated   EVALUATION COMPLEXITY: Low     GOALS: Goals reviewed with patient? Yes   SHORT TERM GOALS: Target date:  07/02/2022  Pt will demonstrate appropriate understanding and performance of initially prescribed HEP in order to facilitate improved independence with management of symptoms.  Baseline: HEP provided on eval 07/25/22: good performance, limited compliance d/t business Goal status: MET   2. Pt will score greater than or equal to 54 on FOTO in order to demonstrate improved perception of function due to symptoms.            Baseline: 49  07/25/22: 73%            Goal status: MET   LONG TERM GOALS: Target date: 07/30/2022    Pt will score 62 or greater on FOTO in order to demonstrate improved perception of function due to symptoms.  Baseline: 49 07/25/22: 73% Goal status: MET   2.  Pt will demonstrate at least 0-105 degrees of L knee AROM in order to facilitate improved tolerance to functional movements such as walking and lifting.  Baseline: -8 ext, 78 flexion 07/25/22: passive to 105 deg, AROM 95-98 deg.  09/06/22: AROM 103, PROM 106 and painful  Goal status: ONGOING   3.  Pt will be able to lift up to 10# in order to demonstrate improved capacity for daily activities such as lifting/carrying drapery for work activities.  Baseline: NT due to proximity to surgery 09/06/22: 10# 5 reps no difficulty Goal status: MET   4.  Pt will be able to perform 5xSTS in  less than or equal to 11sec with appropriate mechanics in order to demonstrate reduced fall risk and improved functional independence (MCID 5xSTS = 2.3 sec). Baseline: 14 sec, with significant anterior placement of LLE 09/06/22: 9 sec no UE support, standard chair   Goal status: MET     5.  Patient will get into a habit of daily stretching to maximize outcomes    Baseline: inconsistent     Goal status: NEW    PLAN: PT FREQUENCY:1- 2x/week   PT DURATION: 4-6 weeks    PLANNED INTERVENTIONS: Therapeutic exercises, Therapeutic activity, Neuromuscular re-education, Balance training, Gait training, Patient/Family education, Self Care,  Joint mobilization, Stair training, DME instructions, Dry Needling, Electrical stimulation, Cryotherapy, Moist heat, Vasopneumatic device, Manual therapy, and Re-evaluation   PLAN FOR NEXT SESSION:   continue functional activity and knee flexion emphasis, end range hamstring/quad contraction  Ashley Murrain PT, DPT 10/16/2022 10:13 AM      Addendum for no visit discharge:   PHYSICAL THERAPY DISCHARGE SUMMARY  Visits from Start of Care: 18  Current functional level related to goals / functional outcomes: Pt able to perform majority of daily activities without pain/limitation    Remaining deficits: Stiffness, pain with end range knee flexion   Education / Equipment: Unable to perform HEP review + discharge education d/t no visit discharge   Patient agrees to discharge. Patient goals were partially met. Patient is being discharged due to the patient's request - pt endorses increased life/work stressors and difficulty attending future appointments.   Ashley Murrain PT, DPT 10/24/2022 5:25 PM

## 2022-10-16 ENCOUNTER — Encounter: Payer: Self-pay | Admitting: Physical Therapy

## 2022-10-16 ENCOUNTER — Ambulatory Visit: Payer: Medicare Other | Admitting: Physical Therapy

## 2022-10-16 DIAGNOSIS — M25662 Stiffness of left knee, not elsewhere classified: Secondary | ICD-10-CM

## 2022-10-16 DIAGNOSIS — R609 Edema, unspecified: Secondary | ICD-10-CM

## 2022-10-16 DIAGNOSIS — M25562 Pain in left knee: Secondary | ICD-10-CM

## 2022-10-16 DIAGNOSIS — R2689 Other abnormalities of gait and mobility: Secondary | ICD-10-CM

## 2022-10-18 ENCOUNTER — Ambulatory Visit: Payer: Medicare Other | Admitting: Physical Therapy

## 2022-10-18 NOTE — Therapy (Deleted)
OUTPATIENT PHYSICAL THERAPY TREATMENT NOTE  Patient Name: Levi Strickland MRN: [id number] DOB:03-10-1956, 67 y.o., male Today's Date: 10/18/2022   PCP: none in chart   REFERRING PROVIDER: Gwenlyn Found MD  END OF SESSION:           No past medical history on file. Past Surgical History:  Procedure Laterality Date   JOINT REPLACEMENT     There are no problems to display for this patient.   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 States he feels work stressors have been contributing to difficulty w/ attendance and HEP adherence which is limiting his progress.    PAIN:  Are you having pain: stiffness, no pain  0/10 Location: medial L knee How would you describe your pain? Sharp, deep Aggravating factors: lifting/carrying, prolonged sitting/driving pending on positioning Easing factors: ice, elevated   OBJECTIVE: (objective measures completed at initial evaluation unless otherwise dated)   DIAGNOSTIC FINDINGS:  None in chart     PATIENT SURVEYS:  FOTO 68 FOTO 07/25/22: 73%  FOTO    COGNITION:           Overall cognitive status: Within functional limits for tasks assessed                          SENSATION: Does not endorse any sensory deficits   EDEMA:  Generalized swelling L knee joint - figure 8 deferred as pt arrives with dressing on incision - appears clean and intact, no concerns.       PALPATION: Palpable tightness L quad/TFL and hip musculature   LOWER EXTREMITY ROM:   Active/Passive ROM Right eval Left eval Left 07/03/22 07/10/22 Left passive only 07/25/22 L AROM/PROM 08/21/22  PROM  09/06/22 L AROM/PROM 09/24/22 AROM  Knee  flexion (Addended 105 deg)  78/85 95/96 98 94/100 105 best  103/105p! AAROM  105 deg   Knee extension 0 -8/-5 0/0 mild subjective tightness  WNL   0   (Blank rows = not tested)   Comments: Performed passive physiological movement flex/extension of L knee with gentle oscillations to reduce muscle guarding, pt reports good relief       LOWER EXTREMITY MMT:     MMT Right eval Left eval Lt 08/02/22 Left 09/06/22  Hip flexion '4 4 5 5  '$ Hip abduction (modified sitting) 5 5 4+ 5  Knee flexion     5 5  Knee extension     5 5   (Blank rows = not tested)   Comments: strain with L hip flexion, no pain. Knee MMT deferred given proximity to surgery     FUNCTIONAL TESTS:  5 times sit to stand: 14 sec STS   - standard chair, no UE support. Significant anterior positioning of LLE and weight shift to RLE noted 09/06/22: 5xSTS 9sec standard chair even BOS no UE support   GAIT: Distance walked: within clinic Assistive device utilized: Single point cane Level of assistance: Modified independence Comments: partial step through pattern, SPC in RUE, fwd flexed posture, reduced stance time and step length on LLE       TODAY'S TREATMENT:   [facility] Adult PT Treatment:  DATE: 10/18/22 Therapeutic Exercise: *** Manual Therapy: *** Neuromuscular re-ed: *** Therapeutic Activity: *** Modalities: *** Self Care: ***  [name] Adult PT Treatment:                                                DATE: 10/16/22 Therapeutic Exercise: Recumbent bike 40mn during subjective SL knee flex 25# 4x8 cues for pacing and full ROM SL knee ext 15# 4x8 cues for foot positioning Squat + UE support 2x8 below parallel, cues for symmetrical WB and hip positioning 6inch lateral step up 2x12 cues for reduced velocity and trunk posture PT resisted hamstring curls in prone 2x5 rest breaks as needed d/t cramping (resolves quickly) PT resisted knee extension in prone 3x5 HEP update  + education   [facility]                                                DATE: 10/[facility]/22 Therapeutic Exercise: Recumbent bike 655m warm up SL knee ext machine 15# 3x8, cues for foot position and rest breaks SL knee flex machine 25#, 3x8 cues for full ROM and control Prone therapist resisted hamstring curls throughout full range, isometric hold at end range 2x5  Prone knee flexion stretch w/ strap 3x30sec cues for breath control Step over 6inch no UE support 2x15 cues for form and control BW squat to parallel x15, x8 with mirror for symmetrical WB. PT assist to maintain symmetry at pelvis    [facility]                                                DATE: 09[facility]24[facility]22 Therapeutic Exercise: Recumbent bike L 2 for L knee ROM Knee ext 25 lbs x 15, 2 sets  Knee flex 35 lbs x 20 Step up 15 lbs FW and lateral , 8 inch step  Step up and over 11 inch used 1 UE due to poor control , balance x 15  Step up and over 8 inch using 1 UE x 15  Prone knee flexion AAROM Supine AAROM Sit to stand , stagger step to increase LLE weightbearing   [facility] Adult PT Treatment:                                                DATE: 09/20/22 Therapeutic Exercise: NuStep L7 LE only for 6 min  Knee ROM: extension and flexion using strap to assist  Hamstring and ITB, adductor stretching  Knee extension banded blue x 45 sec Seated TB knee ext and flexion x 45 sec each  Isometric wall sit 25 lbs x 45 sec Added variations to ankle to increase quad work 25 lbs KB  Wall squat 25 lbs KB full range x 15   B stance lunge with and without wgt Dead lift 25 lbs mod cues for form  Manual Therapy: PROM L knee flexion , Gr I-III mobilization knee flexion  Supine and seated to tolerance  PATIENT EDUCATION:  Education details: HEP performance, importance of adherence, rationale for interventions throughout, AROM and progress. PT POC Person educated: Patient Education method: Explanation,  Demonstration, Tactile cues, Verbal cues Education comprehension: verbalized understanding, returned demonstration, verbal cues required, tactile cues required, and needs further education      HOME EXERCISE PROGRAM: Access Code: [email or web address] URL: [email or web address] Date: 10/16/2022 Prepared by: Enis Slipper  Exercises - Sit to Stand  - 1 x daily - 7 x weekly - 2 sets - 8 reps - Standing Knee Flexion AROM with Chair Support  - 1 x daily - 7 x weekly - 2 sets - 8 reps  ASSESSMENT:   CLINICAL IMPRESSION: Pt arrives w/o pain, reports stiffness. Endorses difficulty w/ attendance and HEP due to work schedule. Today focusing primarily on increasing volume with familiar program, streamlining HEP for increased adherence. No adverse events, pt reports improved symptoms as session goes on. Pt departs today's session in no acute distress, all voiced questions/concerns addressed appropriately from PT perspective.    OBJECTIVE IMPAIRMENTS Abnormal gait, decreased activity tolerance, decreased endurance, decreased mobility, difficulty walking, decreased ROM, decreased strength, hypomobility, increased edema, and pain.    ACTIVITY LIMITATIONS carrying, lifting, bending, standing, squatting, transfers, and dressing   PARTICIPATION LIMITATIONS: driving, community activity, and occupation   PERSONAL FACTORS  None  are also affecting patient's functional outcome.    REHAB POTENTIAL: Good   CLINICAL DECISION MAKING: Stable/uncomplicated   EVALUATION COMPLEXITY: Low     GOALS: Goals reviewed with patient? Yes   SHORT TERM GOALS: Target date: 07/02/2022  Pt will demonstrate appropriate understanding and performance of initially prescribed HEP in order to facilitate improved independence with management of symptoms.  Baseline: HEP provided on eval 07/25/22: good performance, limited compliance d/t business Goal status: MET   2. Pt will score greater than or equal to 54 on FOTO in  order to demonstrate improved perception of function due to symptoms.            Baseline: 49  07/25/22: 73%            Goal status: MET   LONG TERM GOALS: Target date: 07/30/2022    Pt will score 62 or greater on FOTO in order to demonstrate improved perception of function due to symptoms.  Baseline: 49 07/25/22: 73% Goal status: MET   2.  Pt will demonstrate at least 0-105 degrees of L knee AROM in order to facilitate improved tolerance to functional movements such as walking and lifting.  Baseline: -8 ext, 78 flexion 07/25/22: passive to 105 deg, AROM 95-98 deg.  09/06/22: AROM 103, PROM 106 and painful  Goal status: ONGOING   3.  Pt will be able to lift up to 10# in order to demonstrate improved capacity for daily activities such as lifting/carrying drapery for work activities.  Baseline: NT due to proximity to surgery 09/06/22: 10# 5 reps no difficulty Goal status: MET   4.  Pt will be able to perform 5xSTS in less than or equal to 11sec with appropriate mechanics in order to demonstrate reduced fall risk and improved functional independence (MCID 5xSTS = 2.3 sec). Baseline: 14 sec, with significant anterior placement of LLE 09/06/22: 9 sec no UE support, standard chair   Goal status: MET     5.  Patient will get into a habit of daily stretching to maximize outcomes    Baseline: inconsistent     Goal status: NEW    PLAN: PT FREQUENCY:1- 2x/week   PT  DURATION: 4-6 weeks    PLANNED INTERVENTIONS: Therapeutic exercises, Therapeutic activity, Neuromuscular re-education, Balance training, Gait training, Patient/Family education, Self Care, Joint mobilization, Stair training, DME instructions, Dry Needling, Electrical stimulation, Cryotherapy, Moist heat, Vasopneumatic device, Manual therapy, and Re-evaluation   PLAN FOR NEXT SESSION:   continue functional activity and knee flexion emphasis, end range hamstring/quad contraction  Leeroy Cha PT, DPT 10/18/2022 8:08 AM

## 2022-10-23 ENCOUNTER — Ambulatory Visit: Payer: Medicare Other | Admitting: Physical Therapy

## 2022-10-23 ENCOUNTER — Telehealth: Payer: Self-pay | Admitting: Physical Therapy

## 2022-10-23 NOTE — Telephone Encounter (Signed)
Called pt re: this afternoon's missed appt - pt answers, apologetic, notes that he has had increased work/life stressors. He states at this point it is probably best to discharge and do exercises on his own due to his schedule. This is respected, encouraged to follow up if needed and would need new referral to be seen again. Pt verbalizes understanding and agreement with this plan.

## 2022-10-23 NOTE — Therapy (Incomplete)
OUTPATIENT PHYSICAL THERAPY TREATMENT NOTE  Patient Name: Levi Strickland MRN: [id number] DOB:Aug 21, 1956, 66 y.o., male Today's Date: 10/23/2022   PCP: none in chart   REFERRING PROVIDER: Gwenlyn Found MD  END OF SESSION:           No past medical history on file. Past Surgical History:  Procedure Laterality Date   JOINT REPLACEMENT     There are no problems to display for this patient.   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 States he feels work stressors have been contributing to difficulty w/ attendance and HEP adherence which is limiting his progress.    PAIN:  Are you having pain: stiffness, no pain  0/10 Location: medial L knee How would you describe your pain? Sharp, deep Aggravating factors: lifting/carrying, prolonged sitting/driving pending on positioning Easing factors: ice, elevated   OBJECTIVE: (objective measures completed at initial evaluation unless otherwise dated)   DIAGNOSTIC FINDINGS:  None in chart     PATIENT SURVEYS:  FOTO 64 FOTO 07/25/22: 73%  FOTO    COGNITION:           Overall cognitive status: Within functional limits for tasks assessed                          SENSATION: Does not endorse any sensory deficits   EDEMA:  Generalized swelling L knee joint - figure 8 deferred as pt arrives with dressing on incision - appears clean and intact, no concerns.       PALPATION: Palpable tightness L quad/TFL and hip musculature   LOWER EXTREMITY ROM:   Active/Passive ROM Right eval Left eval Left 07/03/22 07/10/22 Left passive only 07/25/22 L AROM/PROM 08/21/22  PROM  09/06/22 L AROM/PROM  09/24/22 AROM  Knee flexion (Addended 105 deg)  78/85 95/96 98 94/100 105 best  103/105p! AAROM  105 deg   Knee extension 0 -8/-5 0/0 mild subjective tightness  WNL   0   (Blank rows = not tested)   Comments: Performed passive physiological movement flex/extension of L knee with gentle oscillations to reduce muscle guarding, pt reports good relief       LOWER EXTREMITY MMT:     MMT Right eval Left eval Lt 08/02/22 Left 09/06/22  Hip flexion 4 4 5 5   Hip abduction (modified sitting) 5 5 4+ 5  Knee flexion     5 5  Knee extension     5 5   (Blank rows = not tested)   Comments: strain with L hip flexion, no pain. Knee MMT deferred given proximity to surgery     FUNCTIONAL TESTS:  5 times sit to stand: 14 sec STS   - standard chair, no UE support. Significant anterior positioning of LLE and weight shift to RLE noted 09/06/22: 5xSTS 9sec standard chair even BOS no UE support   GAIT: Distance walked: within clinic Assistive device utilized: Single point cane Level of assistance: Modified independence Comments: partial step through pattern, SPC in RUE, fwd flexed posture, reduced stance time and step length on LLE       TODAY'S TREATMENT: [facility] Adult PT Treatment:  DATE: 10/23/22 Therapeutic Exercise: *** Manual Therapy: *** Neuromuscular re-ed: *** Therapeutic Activity: *** Modalities: *** Self Care: ***   [name] Adult PT Treatment:                                                DATE: 10/16/22 Therapeutic Exercise: Recumbent bike 103min during subjective SL knee flex 25# 4x8 cues for pacing and full ROM SL knee ext 15# 4x8 cues for foot positioning Squat + UE support 2x8 below parallel, cues for symmetrical WB and hip positioning 6inch lateral step up 2x12 cues for reduced velocity and trunk posture PT resisted hamstring curls in prone 2x5 rest breaks as needed d/t cramping (resolves quickly) PT resisted knee extension in  prone 3x5 HEP update + education   [facility] Adult PT Treatment:                                                DATE: 10/03/22 Therapeutic Exercise: Recumbent bike 5min warm up SL knee ext machine 15# 3x8, cues for foot position and rest breaks SL knee flex machine 25#, 3x8 cues for full ROM and control Prone therapist resisted hamstring curls throughout full range, isometric hold at end range 2x5  Prone knee flexion stretch w/ strap 3x30sec cues for breath control Step over 6inch no UE support 2x15 cues for form and control BW squat to parallel x15, x8 with mirror for symmetrical WB. PT assist to maintain symmetry at pelvis    PATIENT EDUCATION:  Education details: HEP performance, importance of adherence, rationale for interventions throughout, AROM and progress. PT POC Person educated: Patient Education method: Explanation, Demonstration, Tactile cues, Verbal cues Education comprehension: verbalized understanding, returned demonstration, verbal cues required, tactile cues required, and needs further education      HOME EXERCISE PROGRAM: Access Code: [email or web address] URL: [email or web address] Date: 10/16/2022 Prepared by: Enis Slipper  Exercises - Sit to Stand  - 1 x daily - 7 x weekly - 2 sets - 8 reps - Standing Knee Flexion AROM with Chair Support  - 1 x daily - 7 x weekly - 2 sets - 8 reps  ASSESSMENT:   CLINICAL IMPRESSION: ***  *** Pt arrives w/o pain, reports stiffness. Endorses difficulty w/ attendance and HEP due to work schedule. Today focusing primarily on increasing volume with familiar program, streamlining HEP for increased adherence. No adverse events, pt reports improved symptoms as session goes on. Pt departs today's session in no acute distress, all voiced questions/concerns addressed appropriately from PT perspective.    OBJECTIVE IMPAIRMENTS Abnormal gait, decreased activity tolerance, decreased endurance, decreased mobility, difficulty walking,  decreased ROM, decreased strength, hypomobility, increased edema, and pain.    ACTIVITY LIMITATIONS carrying, lifting, bending, standing, squatting, transfers, and dressing   PARTICIPATION LIMITATIONS: driving, community activity, and occupation   PERSONAL FACTORS  None  are also affecting patient's functional outcome.    REHAB POTENTIAL: Good   CLINICAL DECISION MAKING: Stable/uncomplicated   EVALUATION COMPLEXITY: Low     GOALS: Goals reviewed with patient? Yes   SHORT TERM GOALS: Target date: 07/02/2022  Pt will demonstrate appropriate understanding and performance of initially prescribed HEP in order to facilitate improved independence with management of symptoms.  Baseline: HEP provided on eval 07/25/22:  good performance, limited compliance d/t business Goal status: MET   2. Pt will score greater than or equal to 54 on FOTO in order to demonstrate improved perception of function due to symptoms.            Baseline: 49  07/25/22: 73%            Goal status: MET   LONG TERM GOALS: Target date: 07/30/2022    Pt will score 62 or greater on FOTO in order to demonstrate improved perception of function due to symptoms.  Baseline: 49 07/25/22: 73% Goal status: MET   2.  Pt will demonstrate at least 0-105 degrees of L knee AROM in order to facilitate improved tolerance to functional movements such as walking and lifting.  Baseline: -8 ext, 78 flexion 07/25/22: passive to 105 deg, AROM 95-98 deg.  09/06/22: AROM 103, PROM 106 and painful  Goal status: ONGOING   3.  Pt will be able to lift up to 10# in order to demonstrate improved capacity for daily activities such as lifting/carrying drapery for work activities.  Baseline: NT due to proximity to surgery 09/06/22: 10# 5 reps no difficulty Goal status: MET   4.  Pt will be able to perform 5xSTS in less than or equal to 11sec with appropriate mechanics in order to demonstrate reduced fall risk and improved functional independence  (MCID 5xSTS = 2.3 sec). Baseline: 14 sec, with significant anterior placement of LLE 09/06/22: 9 sec no UE support, standard chair   Goal status: MET     5.  Patient will get into a habit of daily stretching to maximize outcomes    Baseline: inconsistent     Goal status: NEW    PLAN: PT FREQUENCY:1- 2x/week   PT DURATION: 4-6 weeks    PLANNED INTERVENTIONS: Therapeutic exercises, Therapeutic activity, Neuromuscular re-education, Balance training, Gait training, Patient/Family education, Self Care, Joint mobilization, Stair training, DME instructions, Dry Needling, Electrical stimulation, Cryotherapy, Moist heat, Vasopneumatic device, Manual therapy, and Re-evaluation   PLAN FOR NEXT SESSION:  *** continue functional activity and knee flexion emphasis, end range hamstring/quad contraction  Leeroy Cha PT, DPT 10/23/2022 1:03 PM
